# Patient Record
Sex: Female | Born: 1965 | Race: White | Hispanic: No | Smoking: Former smoker
Health system: Southern US, Community
[De-identification: ages and names within clinical notes are randomized; demographics above are authoritative.]

## PROBLEM LIST (undated history)

## (undated) DIAGNOSIS — E785 Hyperlipidemia, unspecified: Secondary | ICD-10-CM

## (undated) DIAGNOSIS — K219 Gastro-esophageal reflux disease without esophagitis: Secondary | ICD-10-CM

## (undated) DIAGNOSIS — I6529 Occlusion and stenosis of unspecified carotid artery: Secondary | ICD-10-CM

## (undated) DIAGNOSIS — I7300 Raynaud's syndrome without gangrene: Secondary | ICD-10-CM

## (undated) DIAGNOSIS — F32A Depression, unspecified: Secondary | ICD-10-CM

## (undated) DIAGNOSIS — I739 Peripheral vascular disease, unspecified: Secondary | ICD-10-CM

## (undated) DIAGNOSIS — F419 Anxiety disorder, unspecified: Secondary | ICD-10-CM

## (undated) DIAGNOSIS — M1990 Unspecified osteoarthritis, unspecified site: Secondary | ICD-10-CM

## (undated) DIAGNOSIS — I10 Essential (primary) hypertension: Secondary | ICD-10-CM

## (undated) HISTORY — DX: Raynaud's syndrome without gangrene: I73.00

## (undated) HISTORY — PX: CHOLECYSTECTOMY: SHX55

## (undated) HISTORY — PX: TONSILLECTOMY: SUR1361

## (undated) HISTORY — PX: SPINE SURGERY: SHX786

## (undated) HISTORY — PX: PTCA: SHX146

## (undated) HISTORY — PX: CERVICAL LAMINECTOMY: SHX94

---

## 2006-10-30 ENCOUNTER — Encounter: Payer: Self-pay | Admitting: Internal Medicine

## 2006-11-09 ENCOUNTER — Encounter: Payer: Self-pay | Admitting: Internal Medicine

## 2007-04-17 ENCOUNTER — Other Ambulatory Visit: Payer: Self-pay

## 2007-04-18 ENCOUNTER — Observation Stay: Payer: Self-pay | Admitting: Internal Medicine

## 2007-06-29 ENCOUNTER — Other Ambulatory Visit: Payer: Self-pay

## 2007-06-29 ENCOUNTER — Inpatient Hospital Stay: Payer: Self-pay | Admitting: Unknown Physician Specialty

## 2011-05-18 ENCOUNTER — Ambulatory Visit: Payer: Self-pay

## 2016-04-25 DIAGNOSIS — E782 Mixed hyperlipidemia: Secondary | ICD-10-CM | POA: Insufficient documentation

## 2016-04-25 DIAGNOSIS — I10 Essential (primary) hypertension: Secondary | ICD-10-CM | POA: Insufficient documentation

## 2016-08-21 ENCOUNTER — Encounter: Payer: Self-pay | Admitting: *Deleted

## 2016-08-22 ENCOUNTER — Ambulatory Visit
Admission: RE | Admit: 2016-08-22 | Discharge: 2016-08-22 | Disposition: A | Discharge status: Home / Self Care | Payer: BLUE CROSS/BLUE SHIELD | Source: Ambulatory Visit | Attending: Unknown Physician Specialty | Admitting: Unknown Physician Specialty

## 2016-08-22 ENCOUNTER — Ambulatory Visit: Payer: BLUE CROSS/BLUE SHIELD | Admitting: Anesthesiology

## 2016-08-22 ENCOUNTER — Encounter
Admission: RE | Disposition: A | Discharge status: Home / Self Care | Payer: Self-pay | Source: Ambulatory Visit | Attending: Unknown Physician Specialty

## 2016-08-22 ENCOUNTER — Encounter: Payer: Self-pay | Admitting: *Deleted

## 2016-08-22 DIAGNOSIS — Z87891 Personal history of nicotine dependence: Secondary | ICD-10-CM | POA: Insufficient documentation

## 2016-08-22 DIAGNOSIS — K648 Other hemorrhoids: Secondary | ICD-10-CM | POA: Insufficient documentation

## 2016-08-22 DIAGNOSIS — Z1211 Encounter for screening for malignant neoplasm of colon: Secondary | ICD-10-CM | POA: Diagnosis not present

## 2016-08-22 DIAGNOSIS — D122 Benign neoplasm of ascending colon: Secondary | ICD-10-CM | POA: Insufficient documentation

## 2016-08-22 DIAGNOSIS — Z79899 Other long term (current) drug therapy: Secondary | ICD-10-CM | POA: Diagnosis not present

## 2016-08-22 DIAGNOSIS — Z7982 Long term (current) use of aspirin: Secondary | ICD-10-CM | POA: Diagnosis not present

## 2016-08-22 DIAGNOSIS — D120 Benign neoplasm of cecum: Secondary | ICD-10-CM | POA: Insufficient documentation

## 2016-08-22 DIAGNOSIS — K6389 Other specified diseases of intestine: Secondary | ICD-10-CM | POA: Insufficient documentation

## 2016-08-22 DIAGNOSIS — Z6830 Body mass index (BMI) 30.0-30.9, adult: Secondary | ICD-10-CM | POA: Diagnosis not present

## 2016-08-22 DIAGNOSIS — E785 Hyperlipidemia, unspecified: Secondary | ICD-10-CM | POA: Insufficient documentation

## 2016-08-22 DIAGNOSIS — Z793 Long term (current) use of hormonal contraceptives: Secondary | ICD-10-CM | POA: Diagnosis not present

## 2016-08-22 HISTORY — DX: Essential (primary) hypertension: I10

## 2016-08-22 HISTORY — DX: Hyperlipidemia, unspecified: E78.5

## 2016-08-22 HISTORY — PX: COLONOSCOPY WITH PROPOFOL: SHX5780

## 2016-08-22 HISTORY — DX: Peripheral vascular disease, unspecified: I73.9

## 2016-08-22 SURGERY — COLONOSCOPY WITH PROPOFOL
Anesthesia: General

## 2016-08-22 MED ORDER — SODIUM CHLORIDE 0.9 % IV SOLN: INTRAVENOUS | Status: DC

## 2016-08-22 MED ORDER — LIDOCAINE HCL (PF) 2 % IJ SOLN
INTRAMUSCULAR | Status: DC | PRN
  Administered 2016-08-22: 50 mg

## 2016-08-22 MED ORDER — LABETALOL HCL 5 MG/ML IV SOLN
INTRAVENOUS | Status: DC | PRN
  Administered 2016-08-22: 5 mg via INTRAVENOUS

## 2016-08-22 MED ORDER — MIDAZOLAM HCL 5 MG/5ML IJ SOLN
INTRAMUSCULAR | Status: DC | PRN
  Administered 2016-08-22: 1 mg via INTRAVENOUS

## 2016-08-22 MED ORDER — PROPOFOL 10 MG/ML IV BOLUS
INTRAVENOUS | Status: DC | PRN
  Administered 2016-08-22: 30 mg via INTRAVENOUS
  Administered 2016-08-22: 20 mg via INTRAVENOUS

## 2016-08-22 MED ORDER — PROPOFOL 500 MG/50ML IV EMUL
INTRAVENOUS | Status: DC | PRN
  Administered 2016-08-22: 100 ug/kg/min via INTRAVENOUS

## 2016-08-22 MED ORDER — FENTANYL CITRATE (PF) 100 MCG/2ML IJ SOLN
INTRAMUSCULAR | Status: DC | PRN
  Administered 2016-08-22: 50 ug via INTRAVENOUS

## 2016-08-22 MED ORDER — SODIUM CHLORIDE 0.9 % IV SOLN
INTRAVENOUS | Status: DC
  Administered 2016-08-22: 1000 mL via INTRAVENOUS

## 2016-08-22 MED ORDER — LIDOCAINE HCL (PF) 1 % IJ SOLN
INTRAMUSCULAR | Status: AC
  Filled 2016-08-22: qty 2

## 2016-08-22 NOTE — Transfer of Care (Signed)
Immediate Anesthesia Transfer of Care Note  Patient: Pamela Madden  Procedure(s) Performed: Procedure(s): COLONOSCOPY WITH PROPOFOL (N/A)  Patient Location: PACU  Anesthesia Type:General  Level of Consciousness: sedated  Airway & Oxygen Therapy: Patient Spontanous Breathing and Patient connected to nasal cannula oxygen  Post-op Assessment: Report given to RN and Post -op Vital signs reviewed and stable  Post vital signs: Reviewed and stable  Last Vitals:  Vitals:   08/22/16 1312 08/22/16 1440  BP: (!) 149/83   Pulse: 77   Resp: 20   Temp: 36.3 C 36.4 C    Last Pain:  Vitals:   08/22/16 1440  TempSrc: Tympanic  PainSc: 0-No pain         Complications: No apparent anesthesia complications

## 2016-08-22 NOTE — H&P (Signed)
Primary Care Physician:  Rusty Aus, MD Primary Gastroenterologist:  Dr. Vira Agar  Pre-Procedure History & Physical: HPI:  Pamela Madden is a 50 y.o. female is here for an colonoscopy.   Past Medical History:  Diagnosis Date  . Hyperlipidemia   . Hypertension   . PVD (peripheral vascular disease) ([place])     Past Surgical History:  Procedure Laterality Date  . CERVICAL LAMINECTOMY    . CHOLECYSTECTOMY    . PTCA    . SPINE SURGERY    . TONSILLECTOMY      Prior to Admission medications   Medication Sig Start Date End Date Taking? Authorizing Provider  ALPRAZolam Duanne Moron) 0.5 MG tablet Take 0.5 mg by mouth at bedtime as needed for anxiety.   Yes Historical Provider, MD  Ascorbic Acid (VITAMIN C) 1000 MG tablet Take 1,000 mg by mouth daily.   Yes Historical Provider, MD  aspirin 325 MG tablet Take 325 mg by mouth daily.   Yes Historical Provider, MD  bisoprolol (ZEBETA) 10 MG tablet Take 10 mg by mouth daily.   Yes Historical Provider, MD  buPROPion (WELLBUTRIN XL) 150 MG 24 hr tablet Take 150 mg by mouth daily.   Yes Historical Provider, MD  Coenzyme Q10 (COQ10) 100 MG CAPS Take by mouth.   Yes Historical Provider, MD  etodolac (LODINE) 400 MG tablet Take 400 mg by mouth 2 (two) times daily.   Yes Historical Provider, MD  levonorgestrel-ethinyl estradiol (JOLESSA) 0.15-0.03 MG tablet Take 1 tablet by mouth daily.   Yes Historical Provider, MD  magnesium oxide (MAG-OX) 400 MG tablet Take 400 mg by mouth daily.   Yes Historical Provider, MD  Multiple Vitamin (MULTIVITAMIN) tablet Take 1 tablet by mouth daily.   Yes Historical Provider, MD  pravastatin (PRAVACHOL) 40 MG tablet Take 40 mg by mouth daily.   Yes Historical Provider, MD  Probiotic Product (ACIDOPHILUS PROBIOTIC BLEND PO) Take by mouth.   Yes Historical Provider, MD    Allergies as of 08/07/2016  . (Not on File)    History reviewed. No pertinent family history.  Social History   Social History  . Marital  status: Single    Spouse name: N/A  . Number of children: N/A  . Years of education: N/A   Occupational History  . Not on file.   Social History Main Topics  . Smoking status: Former Smoker    Types: Cigarettes    Quit date: 1998  . Smokeless tobacco: Never Used  . Alcohol use No  . Drug use: No  . Sexual activity: Not on file   Other Topics Concern  . Not on file   Social History Narrative  . No narrative on file    Review of Systems: See HPI, otherwise negative ROS  Physical Exam: BP (!) 149/83   Pulse 77   Temp 97.3 F (36.3 C) (Tympanic)   Resp 20   Ht 5\' 6"  (1.676 m)   Wt 86.2 kg (190 lb)   LMP  (LMP Unknown)   BMI 30.67 kg/m  General:   Alert,  pleasant and cooperative in NAD Head:  Normocephalic and atraumatic. Neck:  Supple; no masses or thyromegaly. Lungs:  Clear throughout to auscultation.    Heart:  Regular rate and rhythm. Abdomen:  Soft, nontender and nondistended. Normal bowel sounds, without guarding, and without rebound.   Neurologic:  Alert and  oriented x4;  grossly normal neurologically.  Impression/Plan: Pamela Madden is here for an colonoscopy to  be performed for screening for colon cancer with [facility].  Risks, benefits, limitations, and alternatives regarding  colonoscopy have been reviewed with the patient.  Questions have been answered.  All parties agreeable.   Gaylyn Cheers, MD  08/22/2016, 2:05 PM

## 2016-08-22 NOTE — Op Note (Signed)
[facility] Gastroenterology Patient Name: Pamela Madden Procedure Date: 08/22/2016 2:06 PM MRN: [id number] Account #: [id number] Date of Birth: 11/28/1965 Admit Type: Outpatient Age: 50 Room: [facility] ENDO ROOM 4 Gender: Female Note Status: Finalized Procedure:            Colonoscopy Indications:          Screening for colorectal malignant neoplasm Providers:            Manya Silvas, MD Referring MD:         Rusty Aus, MD (Referring MD) Medicines:            Propofol per Anesthesia Complications:        No immediate complications. Procedure:            Pre-Anesthesia Assessment:                       - After reviewing the risks and benefits, the patient                        was deemed in satisfactory condition to undergo the                        procedure.                       After obtaining informed consent, the colonoscope was                        passed under direct vision. Throughout the procedure,                        the patient's blood pressure, pulse, and oxygen                        saturations were monitored continuously. The                        Colonoscope was introduced through the anus and                        advanced to the the cecum, identified by appendiceal                        orifice and ileocecal valve. The colonoscopy was                        performed without difficulty. The patient tolerated the                        procedure well. The quality of the bowel preparation                        was good. Findings:      A diminutive polyp was found in the ascending colon. The polyp was       sessile. The polyp was removed with a jumbo cold forceps. Resection and       retrieval were complete.      A small polyp was found in the cecum. The polyp was sessile. The polyp       was removed with a hot snare. Resection and retrieval were complete.  A diminutive polyp was found in the cecum. The polyp was sessile.  The       polyp was removed with a jumbo cold forceps. Resection and retrieval       were complete.      A diffuse area of moderate melanosis was found in the sigmoid colon, in       the descending colon, in the transverse colon, in the ascending colon       and in the cecum.      Internal hemorrhoids were found during endoscopy. The hemorrhoids were       small and Grade I (internal hemorrhoids that do not prolapse).      The exam was otherwise without abnormality. Impression:           - One diminutive polyp in the ascending colon, removed                        with a jumbo cold forceps. Resected and retrieved.                       - One small polyp in the cecum, removed with a hot                        snare. Resected and retrieved.                       - One diminutive polyp in the cecum, removed with a                        jumbo cold forceps. Resected and retrieved.                       - Melanosis in the colon.                       - Internal hemorrhoids.                       - The examination was otherwise normal. Recommendation:       - Await pathology results. Manya Silvas, MD 08/22/2016 2:41:22 PM This report has been signed electronically. Number of Addenda: 0 Note Initiated On: 08/22/2016 2:06 PM Scope Withdrawal Time: 0 hours 17 minutes 25 seconds  Total Procedure Duration: 0 hours 24 minutes 35 seconds       [facility]

## 2016-08-22 NOTE — Anesthesia Preprocedure Evaluation (Signed)
Anesthesia Evaluation  Patient identified by MRN, date of birth, ID band Patient awake    Reviewed: Allergy & Precautions  Airway Mallampati: II       Dental  (+) Teeth Intact   Pulmonary neg pulmonary ROS, former smoker,     + decreased breath sounds      Cardiovascular Exercise Tolerance: Good hypertension, Pt. on home beta blockers + Peripheral Vascular Disease   Rhythm:Regular Rate:Normal     Neuro/Psych negative neurological ROS     GI/Hepatic negative GI ROS, Neg liver ROS,   Endo/Other  Morbid obesity  Renal/GU negative Renal ROS     Musculoskeletal   Abdominal   Peds  Hematology negative hematology ROS (+)   Anesthesia Other Findings   Reproductive/Obstetrics                             Anesthesia Physical Anesthesia Plan  ASA: II  Anesthesia Plan: General   Post-op Pain Management:    Induction:   Airway Management Planned: Natural Airway and Nasal Cannula  Additional Equipment:   Intra-op Plan:   Post-operative Plan:   Informed Consent: I have reviewed the patients History and Physical, chart, labs and discussed the procedure including the risks, benefits and alternatives for the proposed anesthesia with the patient or authorized representative who has indicated his/her understanding and acceptance.     Plan Discussed with: CRNA  Anesthesia Plan Comments:         Anesthesia Quick Evaluation

## 2016-08-23 ENCOUNTER — Encounter: Payer: Self-pay | Admitting: Unknown Physician Specialty

## 2016-08-24 LAB — SURGICAL PATHOLOGY

## 2016-08-27 NOTE — Anesthesia Postprocedure Evaluation (Signed)
Anesthesia Post Note  Patient: [name]  Procedure(s) Performed: Procedure(s) (LRB): COLONOSCOPY WITH PROPOFOL (N/A)  Patient location during evaluation: PACU Anesthesia Type: General Level of consciousness: awake Pain management: pain level controlled Vital Signs Assessment: post-procedure vital signs reviewed and stable Respiratory status: spontaneous breathing Cardiovascular status: stable Anesthetic complications: no    Last Vitals:  Vitals:   08/22/16 1500 08/22/16 1520  BP: (!) 168/80 (!) 152/79  Pulse: 74 78  Resp: 18 17  Temp:      Last Pain:  Vitals:   08/23/16 0808  TempSrc:   PainSc: 0-No pain                 VAN STAVEREN,Armonii Sieh

## 2016-10-29 DIAGNOSIS — I7300 Raynaud's syndrome without gangrene: Secondary | ICD-10-CM | POA: Insufficient documentation

## 2016-10-29 DIAGNOSIS — D369 Benign neoplasm, unspecified site: Secondary | ICD-10-CM | POA: Insufficient documentation

## 2016-12-05 ENCOUNTER — Other Ambulatory Visit: Payer: Self-pay | Admitting: Obstetrics and Gynecology

## 2016-12-05 DIAGNOSIS — N6489 Other specified disorders of breast: Secondary | ICD-10-CM

## 2016-12-12 ENCOUNTER — Inpatient Hospital Stay
Admission: RE | Admit: 2016-12-12 | Discharge: 2016-12-12 | Disposition: A | Discharge status: Home / Self Care | Payer: Self-pay | Source: Ambulatory Visit | Attending: *Deleted | Admitting: *Deleted

## 2016-12-12 ENCOUNTER — Other Ambulatory Visit: Payer: Self-pay | Admitting: *Deleted

## 2016-12-12 DIAGNOSIS — Z9289 Personal history of other medical treatment: Secondary | ICD-10-CM

## 2016-12-17 ENCOUNTER — Other Ambulatory Visit: Payer: Self-pay | Admitting: Obstetrics and Gynecology

## 2016-12-17 DIAGNOSIS — N6489 Other specified disorders of breast: Secondary | ICD-10-CM

## 2016-12-31 ENCOUNTER — Encounter: Payer: Self-pay | Admitting: *Deleted

## 2016-12-31 ENCOUNTER — Ambulatory Visit: Payer: BLUE CROSS/BLUE SHIELD | Attending: Oncology | Admitting: *Deleted

## 2016-12-31 VITALS — BP 132/85 | HR 83 | Temp 97.4°F | Wt 207.4 lb

## 2016-12-31 DIAGNOSIS — N6489 Other specified disorders of breast: Secondary | ICD-10-CM

## 2016-12-31 DIAGNOSIS — I739 Peripheral vascular disease, unspecified: Secondary | ICD-10-CM | POA: Insufficient documentation

## 2016-12-31 NOTE — Progress Notes (Signed)
Subjective:     Patient ID: MARTENA EMANUELE, female   DOB: 1965-10-18, 51 y.o.   MRN: [id number]  HPI   Review of Systems     Objective:   Physical Exam  Pulmonary/Chest: Right breast exhibits no inverted nipple, no mass, no nipple discharge, no skin change and no tenderness. Left breast exhibits no inverted nipple, no mass, no nipple discharge, no skin change and no tenderness. Breasts are symmetrical.         Assessment:     51 year old White female referred to [facility] by Dr. Leafy Ro for financial assistance for mammogram and follow-up of an abnormal pap smear.  Last mammogram on 11/19/16 was a birads 3.  Patient did not follow-up because her insurance did not pay for a diagnostic mammogram, and she could not afford it.  Clinical breast exam today unremarkable. Taught self breast awareness.  Last pap on 11/19/16 was HPV positive ASCUS.  She will also need referred to GYN for colposcopy and possible biopsy.  Since [facility] is not contracted with [facility] for gyn services, I will need to schedule her with [facility] or [facility].  She is agreeable to either one.  Patient has been screened for eligibility.  She does not have any insurance, Medicare or Medicaid.  She also meets financial eligibility.  Hand-out given on the Affordable Care Act.     Plan:     Bilateral diagnostic mammogram and ultrasound ordered for follow-up of asymmetry and annual screening.  Patient is scheduled for Jan 18, 2017 @ 3:20.  Will have Jeanella Anton schedule patient for her gyn consult.  Will follow-up per [facility] protocol.

## 2016-12-31 NOTE — Patient Instructions (Signed)
Gave patient hand-out, Women Staying Healthy, Active and Well from [facility], with education on breast health, pap smears, heart and colon health. 

## 2017-01-15 ENCOUNTER — Other Ambulatory Visit: Payer: Self-pay | Admitting: Obstetrics and Gynecology

## 2017-01-15 ENCOUNTER — Ambulatory Visit (INDEPENDENT_AMBULATORY_CARE_PROVIDER_SITE_OTHER): Payer: BLUE CROSS/BLUE SHIELD | Admitting: Obstetrics and Gynecology

## 2017-01-15 ENCOUNTER — Encounter: Payer: Self-pay | Admitting: Obstetrics and Gynecology

## 2017-01-15 VITALS — BP 112/73 | HR 73 | Ht 67.0 in | Wt 209.2 lb

## 2017-01-15 DIAGNOSIS — B977 Papillomavirus as the cause of diseases classified elsewhere: Secondary | ICD-10-CM

## 2017-01-15 DIAGNOSIS — N72 Inflammatory disease of cervix uteri: Secondary | ICD-10-CM

## 2017-01-15 DIAGNOSIS — R87612 Low grade squamous intraepithelial lesion on cytologic smear of cervix (LGSIL): Secondary | ICD-10-CM

## 2017-01-15 NOTE — Progress Notes (Signed)
Referring Provider:  [facility]  HPI:  Pamela Madden is a 51 y.o.  [phone]  who presents today for evaluation and management of abnormal cervical cytology.    Dysplasia History:  LGSIL with POS HPV She thinks this is her first abnormal Pap.  ROS:  Pertinent items are noted in HPI.   She believes that she is newly menopausal but is taking continuous progesterone so she is unsure of any menstrual periods.  OB History  Gravida Para Term Preterm AB Living  2 2 2     2   SAB TAB Ectopic Multiple Live Births          2    # Outcome Date GA Lbr Len/2nd Weight Sex Delivery Anes PTL Lv  2 Term 1989    M Vag-Spont  N LIV  1 Term 1984    M Vag-Spont  N LIV      Past Medical History:  Diagnosis Date  . Hyperlipidemia   . Hypertension   . PVD (peripheral vascular disease) ([place])   . Raynaud disease     Past Surgical History:  Procedure Laterality Date  . CERVICAL LAMINECTOMY    . CHOLECYSTECTOMY    . COLONOSCOPY WITH PROPOFOL N/A 08/22/2016   Procedure: COLONOSCOPY WITH PROPOFOL;  Surgeon: Manya Silvas, MD;  Location: [facility] ENDOSCOPY;  Service: Endoscopy;  Laterality: N/A;  . PTCA    . SPINE SURGERY    . TONSILLECTOMY      SOCIAL HISTORY: History  Alcohol Use No   History  Drug Use No     Family History  Problem Relation Age of Onset  . Diabetes Mother   . Diabetes Father   . Diabetes Sister   . Stroke Paternal Grandmother     ALLERGIES:  Ace inhibitors; Erythromycin; and Iodine  She has a current medication list which includes the following prescription(s): alprazolam, aspirin, bisoprolol, bupropion, magnesium oxide, meloxicam, multivitamin, norethindrone, pravastatin, probiotic product, and shingrix.  Physical Exam: -Vitals:  BP 112/73   Pulse 73   Ht 5\' 7"  (1.702 m)   Wt 209 lb 3 oz (94.9 kg)   BMI 32.76 kg/m  GEN: WD, WN, NAD.  A+ O x 3, good mood and affect. ABD:  NT, ND.  Soft, no masses.  No hernias noted.   Pelvic:   Vulva: Normal appearance.  No  lesions.  Vagina: No lesions or abnormalities noted.  Support: Normal pelvic support.  Urethra No masses tenderness or scarring.  Meatus Normal size without lesions or prolapse.  Cervix: See below.  Anus: Normal exam.  No lesions.  Perineum: Normal exam.  No lesions.        Bimanual   Uterus: Normal size.  Non-tender.  Mobile.  AV.  Adnexae: No masses.  Non-tender to palpation.  Cul-de-sac: Negative for abnormality.   PROCEDURE: 1.  Urine Pregnancy Test:  negative 2.  Colposcopy performed with 4% acetic acid after verbal consent obtained                           -Aceto-white Lesions Location(s): 4-5 o'clock.              -Biopsy performed at 5 & 8 o'clock               -ECC indicated and performed: No.     -Biopsy sites made hemostatic with pressure and Monsel's solution   -Satisfactory colposcopy: Yes.      -Evidence of  Invasive cervical CA :  NO  ASSESSMENT:  Pamela Madden is a 51 y.o. [id number] here for  1. Low grade squamous intraepithelial lesion on cytologic smear of cervix (LGSIL)   2. High risk human papilloma virus (HPV) infection of cervix     PLAN: 1.  I discussed the grading system of pap smears and HPV high risk viral types.  We will discuss management after colpo results return.  Meds ordered this encounter  Medications  . SHINGRIX injection    Refill:  0          F/U  Return in about 1 week (around 01/22/2017).  Jeannie Fend ,MD 01/15/2017,11:33 AM

## 2017-01-17 LAB — PATHOLOGY

## 2017-01-18 ENCOUNTER — Ambulatory Visit
Admission: RE | Admit: 2017-01-18 | Discharge: 2017-01-18 | Disposition: A | Discharge status: Home / Self Care | Payer: BLUE CROSS/BLUE SHIELD | Source: Ambulatory Visit | Attending: Oncology | Admitting: Oncology

## 2017-01-18 DIAGNOSIS — N6489 Other specified disorders of breast: Secondary | ICD-10-CM

## 2017-01-22 ENCOUNTER — Ambulatory Visit (INDEPENDENT_AMBULATORY_CARE_PROVIDER_SITE_OTHER): Payer: BLUE CROSS/BLUE SHIELD | Admitting: Obstetrics and Gynecology

## 2017-01-22 ENCOUNTER — Encounter: Payer: Self-pay | Admitting: Obstetrics and Gynecology

## 2017-01-22 VITALS — BP 118/77 | HR 116 | Ht 67.0 in | Wt 208.1 lb

## 2017-01-22 DIAGNOSIS — B977 Papillomavirus as the cause of diseases classified elsewhere: Secondary | ICD-10-CM | POA: Diagnosis not present

## 2017-01-22 DIAGNOSIS — N951 Menopausal and female climacteric states: Secondary | ICD-10-CM

## 2017-01-22 DIAGNOSIS — N72 Inflammatory disease of cervix uteri: Secondary | ICD-10-CM

## 2017-01-22 NOTE — Progress Notes (Signed)
HPI:      [name] is a 51 y.o. [phone] who LMP was No LMP recorded. Patient is not currently having periods (Reason: Oral contraceptives).  Subjective:   She presents today For follow-up of her colposcopy. She also complains of being tired, having hot flashes and night sweats, and being disappointed at not taking OCPs any longer (she is on progesterone only pills).  She is very afraid of the possibility of ovulation and possible pregnancy and is not sure if she is in menopause. She would like to take some type of estrogen if possible.    Hx: The following portions of the patient's history were reviewed and updated as appropriate:             She  has a past medical history of Hyperlipidemia; Hypertension; PVD (peripheral vascular disease) ([place]); and Raynaud disease. She  does not have any pertinent problems on file. She  has a past surgical history that includes Spine surgery; Cervical laminectomy; Cholecystectomy; Mitral valve replacement; Tonsillectomy; and Colonoscopy with propofol (N/A, 08/22/2016). Her family history includes Diabetes in her father, mother, and sister; Stroke in her paternal grandmother. She  reports that she quit smoking about 20 years ago. Her smoking use included Cigarettes. She has never used smokeless tobacco. She reports that she does not drink alcohol or use drugs. She is allergic to ace inhibitors; erythromycin; and iodine.       Review of Systems:  Review of Systems  Constitutional: Denied constitutional symptoms, night sweats, recent illness, fatigue, fever, insomnia and weight loss.  Eyes: Denied eye symptoms, eye pain, photophobia, vision change and visual disturbance.  Ears/Nose/Throat/Neck: Denied ear, nose, throat or neck symptoms, hearing loss, nasal discharge, sinus congestion and sore throat.  Cardiovascular: Denied cardiovascular symptoms, arrhythmia, chest pain/pressure, edema, exercise intolerance, orthopnea and palpitations.   Respiratory: Denied pulmonary symptoms, asthma, pleuritic pain, productive sputum, cough, dyspnea and wheezing.  Gastrointestinal: Denied, gastro-esophageal reflux, melena, nausea and vomiting.  Genitourinary: Denied genitourinary symptoms including symptomatic vaginal discharge, pelvic relaxation issues, and urinary complaints.  Musculoskeletal: Denied musculoskeletal symptoms, stiffness, swelling, muscle weakness and myalgia.  Dermatologic: Denied dermatology symptoms, rash and scar.  Neurologic: Denied neurology symptoms, dizziness, headache, neck pain and syncope.  Psychiatric: Denied psychiatric symptoms, anxiety and depression.  Endocrine: See HPI for additional information.   Meds:   Current Outpatient Prescriptions on File Prior to Visit  Medication Sig Dispense Refill  . ALPRAZolam (XANAX) 0.5 MG tablet Take 0.5 mg by mouth at bedtime as needed for anxiety.    Marland Kitchen aspirin 325 MG tablet Take 325 mg by mouth daily.    Marland Kitchen buPROPion (WELLBUTRIN XL) 150 MG 24 hr tablet Take 150 mg by mouth daily.    . magnesium oxide (MAG-OX) 400 MG tablet Take 400 mg by mouth daily.    . meloxicam (MOBIC) 15 MG tablet Take by mouth.    . Multiple Vitamin (MULTIVITAMIN) tablet Take 1 tablet by mouth daily.    . norethindrone (MICRONOR,CAMILA,ERRIN) 0.35 MG tablet Take by mouth.    . pravastatin (PRAVACHOL) 40 MG tablet Take 40 mg by mouth daily.    . Probiotic Product (ACIDOPHILUS PROBIOTIC BLEND PO) Take by mouth.     No current facility-administered medications on file prior to visit.     Objective:     Vitals:   01/22/17 1446  BP: 118/77  Pulse: (!) 116              Colposcopy results reviewed in detail  with the patient. Koilocytic change and HPV effect discussed at great length.  Assessment:    [id number] Patient Active Problem List   Diagnosis Date Noted  . Peripheral vascular disease ([place]) 12/31/2016  . Raynaud's disease without gangrene 10/29/2016  . Tubular adenoma 10/29/2016  .  Benign essential hypertension 04/25/2016  . Hyperlipidemia, mixed 04/25/2016     1. High risk human papilloma virus (HPV) infection of cervix   2. Symptomatic menopausal or female climacteric states     Minimal changes at colposcopy.  Patient very symptomatic with menopausal type symptoms now no longer taking estrogen. Patient very interested in HRT for birth control which ever is appropriate.    Plan:            1.  We have discussed her abnormal Pap smear and colposcopy in detail. I recommended a follow-up Pap in 6 months.  2.  [facility]SH today - will consider HRT or OCPs  Orders Placed This Encounter  Procedures  . Follicle stimulating hormone         F/U  Return in about 6 months (around 07/25/2017) for We will contact her with any abnormal test results.  Finis Bud, M.D. 01/22/2017 3:33 PM

## 2017-01-23 LAB — FOLLICLE STIMULATING HORMONE: FSH: 68.6 m[IU]/mL

## 2017-01-24 ENCOUNTER — Telehealth: Payer: Self-pay

## 2017-01-24 NOTE — Telephone Encounter (Signed)
-----   Message from Harlin Heys, MD sent at 01/23/2017  5:42 PM EDT ----- Based on these results, please have her schedule a visit with me.

## 2017-01-24 NOTE — Telephone Encounter (Signed)
Mychart message sent to pt to schedule appt. For lab review.

## 2017-01-25 ENCOUNTER — Encounter: Payer: Self-pay | Admitting: Obstetrics and Gynecology

## 2017-01-25 ENCOUNTER — Ambulatory Visit (INDEPENDENT_AMBULATORY_CARE_PROVIDER_SITE_OTHER): Payer: BLUE CROSS/BLUE SHIELD | Admitting: Obstetrics and Gynecology

## 2017-01-25 DIAGNOSIS — N951 Menopausal and female climacteric states: Secondary | ICD-10-CM | POA: Diagnosis not present

## 2017-01-25 DIAGNOSIS — Z79890 Hormone replacement therapy (postmenopausal): Secondary | ICD-10-CM | POA: Diagnosis not present

## 2017-01-25 MED ORDER — ESTRADIOL-NORETHINDRONE ACET 1-0.5 MG PO TABS: 1.0000 | ORAL_TABLET | Freq: Every day | ORAL | 0 refills | Status: DC

## 2017-01-25 NOTE — Progress Notes (Signed)
HPI:      [name] is a 51 y.o. [id number] who is not having menstrual periods.  Subjective:   She presents today To further discuss her symptoms of menopause which include mood swings, hot flashes, difficulty sleeping, and generalized depressive symptoms. She is also on bupropion and would like to discontinue this medicine as it does not seem to be working for her. She is taking daily progesterone.    Hx: The following portions of the patient's history were reviewed and updated as appropriate:             She  has a past medical history of Hyperlipidemia; Hypertension; PVD (peripheral vascular disease) ([place]); and Raynaud disease. She  does not have any pertinent problems on file. She  has a past surgical history that includes Spine surgery; Cervical laminectomy; Cholecystectomy; Mitral valve replacement; Tonsillectomy; and Colonoscopy with propofol (N/A, 08/22/2016). Her family history includes Diabetes in her father, mother, and sister; Stroke in her paternal grandmother. She  reports that she quit smoking about 20 years ago. Her smoking use included Cigarettes. She has never used smokeless tobacco. She reports that she does not drink alcohol or use drugs. She is allergic to ace inhibitors; erythromycin; and iodine.       Review of Systems:  Review of Systems  Constitutional: Denied constitutional symptoms, night sweats, recent illness, fatigue, fever, insomnia and weight loss.  Eyes: Denied eye symptoms, eye pain, photophobia, vision change and visual disturbance.  Ears/Nose/Throat/Neck: Denied ear, nose, throat or neck symptoms, hearing loss, nasal discharge, sinus congestion and sore throat.  Cardiovascular: Denied cardiovascular symptoms, arrhythmia, chest pain/pressure, edema, exercise intolerance, orthopnea and palpitations.  Respiratory: Denied pulmonary symptoms, asthma, pleuritic pain, productive sputum, cough, dyspnea and wheezing.  Gastrointestinal: Denied,  gastro-esophageal reflux, melena, nausea and vomiting.  Genitourinary: Denied genitourinary symptoms including symptomatic vaginal discharge, pelvic relaxation issues, and urinary complaints.  Musculoskeletal: Denied musculoskeletal symptoms, stiffness, swelling, muscle weakness and myalgia.  Dermatologic: Denied dermatology symptoms, rash and scar.  Neurologic: Denied neurology symptoms, dizziness, headache, neck pain and syncope.  Psychiatric: Denied psychiatric symptoms, anxiety and depression.  Endocrine: See HPI for additional information.   Meds:   Current Outpatient Prescriptions on File Prior to Visit  Medication Sig Dispense Refill  . ALPRAZolam (XANAX) 0.5 MG tablet Take 0.5 mg by mouth at bedtime as needed for anxiety.    Marland Kitchen aspirin 325 MG tablet Take 325 mg by mouth daily.    Marland Kitchen buPROPion (WELLBUTRIN XL) 150 MG 24 hr tablet Take 150 mg by mouth daily.    Marland Kitchen losartan (COZAAR) 25 MG tablet Take by mouth.    . magnesium oxide (MAG-OX) 400 MG tablet Take 400 mg by mouth daily.    . meloxicam (MOBIC) 15 MG tablet Take by mouth.    . Multiple Vitamin (MULTIVITAMIN) tablet Take 1 tablet by mouth daily.    . norethindrone (MICRONOR,CAMILA,ERRIN) 0.35 MG tablet Take by mouth.    . pravastatin (PRAVACHOL) 40 MG tablet Take 40 mg by mouth daily.    . Probiotic Product (ACIDOPHILUS PROBIOTIC BLEND PO) Take by mouth.    . Zoster Vac Recomb Adjuvanted [facility]) injection Inject 0.5 mLs into the muscle once.     No current facility-administered medications on file prior to visit.     Objective:     There were no vitals filed for this visit.              Assessment:    [id number] Patient Active Problem  List   Diagnosis Date Noted  . Peripheral vascular disease ([place]) 12/31/2016  . Raynaud's disease without gangrene 10/29/2016  . Tubular adenoma 10/29/2016  . Benign essential hypertension 04/25/2016  . Hyperlipidemia, mixed 04/25/2016     1. Postmenopausal hormone therapy   2.  Symptomatic menopausal or female climacteric states     [place] consistent with menopause.   Plan:            1.  HRT I have discussed HRT with the patient in detail.  The risk/benefits of it were reviewed.  She understands that during menopause Estrogen decreases dramatically and that this results in an increased risk of cardiovascular disease as well as osteoporosis.  We have also discussed the fact that hot flashes often result from a decrease in Estrogen, and that by replacing Estrogen, they can often be alleviated.  We have discussed skin, vaginal and urinary tract changes that may also take place from this drop in Estrogen.  Emotional changes have also been linked to Estrogen and we have briefly discussed this.  The benefits of HRT including decrease in hot flashes, vaginal dryness, and osteoporosis were discussed.  The emotional benefit and a possible change in her cardiovascular risk profile was also reviewed.  The risks associated with Hormone Replacement Therapy were also reviewed.  The use of unopposed Estrogen and its relationship to endometrial cancer was discussed.  The addition of Progesterone and its beneficial effect on endometrial cancer was also noted.  The fact that there has been no consistent definitive studies showing an increase in breast cancer in women who use HRT was discussed with the patient.  The possible side effects including breast tenderness, fluid retention, mood changes and vaginal bleeding were discussed.  The patient was informed that this is an elective medication and that she may choose not to take Hormone Replacement Therapy.  Literature on HRT was given, and I believe that after answering all of the patient's questions, she has an adequate and informed understanding of HRT.  Special emphasis on the WHI study, as well as several studies since that pertaining to the risks and benefits of estrogen replacement therapy were compared.  The possible limitations of these  studies were discussed including the age stratification of the WHI study.  The possible role of Progesterone in these studies was discussed in detail.  I believe that the patient has an informed knowledge of the risks and benefits of HRT.  I have specifically discussed WHI findings and current updates.  Different type of hormone formulation and methods of taking hormone replacement therapy discussed.   We have specifically discussed her peripheral vascular disease and the possible estrogen and progesterone effects.     2.  After taking HRT for 3 weeks patient will gradually wean off of bupropion as directed.    Meds ordered this encounter  Medications  . estradiol-norethindrone (ACTIVELLA) 1-0.5 MG tablet    Sig: Take 1 tablet by mouth daily.    Dispense:  90 tablet    Refill:  0        F/U  Return in about 3 months (around 04/27/2017).  Finis Bud, M.D. 01/25/2017 11:22 AM

## 2017-02-07 ENCOUNTER — Encounter: Payer: Self-pay | Admitting: *Deleted

## 2017-02-07 NOTE — Progress Notes (Signed)
Letter mailed from the [facility] to inform patient of her normal mammogram results.  Patient is to follow-up with annual screening in one year.  Patient will need next pap in 6 months with Dr. Amalia Hailey at [facility].  [facility]IS to [place].

## 2017-03-06 ENCOUNTER — Other Ambulatory Visit: Payer: Self-pay | Admitting: Obstetrics and Gynecology

## 2017-03-06 DIAGNOSIS — N951 Menopausal and female climacteric states: Secondary | ICD-10-CM

## 2017-03-06 DIAGNOSIS — Z79890 Hormone replacement therapy (postmenopausal): Secondary | ICD-10-CM

## 2017-03-28 ENCOUNTER — Encounter: Payer: Self-pay | Admitting: Obstetrics and Gynecology

## 2017-04-08 ENCOUNTER — Telehealth: Payer: Self-pay | Admitting: Obstetrics and Gynecology

## 2017-04-08 ENCOUNTER — Other Ambulatory Visit: Payer: Self-pay

## 2017-04-08 DIAGNOSIS — N951 Menopausal and female climacteric states: Secondary | ICD-10-CM

## 2017-04-08 DIAGNOSIS — Z79890 Hormone replacement therapy (postmenopausal): Secondary | ICD-10-CM

## 2017-04-08 NOTE — Telephone Encounter (Signed)
Patient needs refill on her [facility] pills sent to [facility] she only has 14 days left

## 2017-04-09 ENCOUNTER — Other Ambulatory Visit: Payer: Self-pay

## 2017-04-09 DIAGNOSIS — Z79890 Hormone replacement therapy (postmenopausal): Secondary | ICD-10-CM

## 2017-04-09 DIAGNOSIS — N951 Menopausal and female climacteric states: Secondary | ICD-10-CM

## 2017-04-09 MED ORDER — ESTRADIOL-NORETHINDRONE ACET 1-0.5 MG PO TABS: 1.0000 | ORAL_TABLET | Freq: Every day | ORAL | 0 refills | Status: DC

## 2017-04-09 NOTE — Telephone Encounter (Signed)
mychart message sent

## 2017-04-30 ENCOUNTER — Encounter: Payer: PRIVATE HEALTH INSURANCE | Admitting: Obstetrics and Gynecology

## 2017-05-01 ENCOUNTER — Encounter: Payer: PRIVATE HEALTH INSURANCE | Admitting: Obstetrics and Gynecology

## 2017-05-01 ENCOUNTER — Encounter: Payer: Self-pay | Admitting: Obstetrics and Gynecology

## 2017-05-01 ENCOUNTER — Ambulatory Visit (INDEPENDENT_AMBULATORY_CARE_PROVIDER_SITE_OTHER): Payer: BLUE CROSS/BLUE SHIELD | Admitting: Obstetrics and Gynecology

## 2017-05-01 VITALS — BP 137/77 | HR 77 | Ht 67.0 in | Wt 210.1 lb

## 2017-05-01 DIAGNOSIS — Z79890 Hormone replacement therapy (postmenopausal): Secondary | ICD-10-CM | POA: Diagnosis not present

## 2017-05-01 DIAGNOSIS — N951 Menopausal and female climacteric states: Secondary | ICD-10-CM

## 2017-05-01 MED ORDER — ESTRADIOL-NORETHINDRONE ACET 1-0.5 MG PO TABS: 1.0000 | ORAL_TABLET | Freq: Every day | ORAL | 8 refills | Status: DC

## 2017-05-01 NOTE — Progress Notes (Signed)
HPI:      [name] is a 51 y.o. [phone] who LMP was No LMP recorded. Patient is not currently having periods (Reason: Oral contraceptives).  Subjective:   She presents today For follow-up of HRT. She reports no bleeding. She says that her hot flashes and night sweats have resolved, she is sleeping better, her mood issues have resolved. She is disappointed in the fact that she is tired at the end of the day and this has not seemed to affect her energy level. She has discussed this with her family physician but there has been no resolution. He ordered lab work including thyroid testing which she reports is all normal.    Hx: The following portions of the patient's history were reviewed and updated as appropriate:             She  has a past medical history of Hyperlipidemia; Hypertension; PVD (peripheral vascular disease) ([place]); and Raynaud disease. She  does not have any pertinent problems on file. She  has a past surgical history that includes Spine surgery; Cervical laminectomy; Cholecystectomy; Mitral valve replacement; Tonsillectomy; and Colonoscopy with propofol (N/A, 08/22/2016). Her family history includes Diabetes in her father, mother, and sister; Stroke in her paternal grandmother. She  reports that she quit smoking about 20 years ago. Her smoking use included Cigarettes. She has never used smokeless tobacco. She reports that she does not drink alcohol or use drugs. She is allergic to ace inhibitors; erythromycin; and iodine.       Review of Systems:  Review of Systems  Constitutional: Denied constitutional symptoms, night sweats, recent illness, fatigue, fever, insomnia and weight loss.  Eyes: Denied eye symptoms, eye pain, photophobia, vision change and visual disturbance.  Ears/Nose/Throat/Neck: Denied ear, nose, throat or neck symptoms, hearing loss, nasal discharge, sinus congestion and sore throat.  Cardiovascular: Denied cardiovascular symptoms, arrhythmia, chest  pain/pressure, edema, exercise intolerance, orthopnea and palpitations.  Respiratory: Denied pulmonary symptoms, asthma, pleuritic pain, productive sputum, cough, dyspnea and wheezing.  Gastrointestinal: Denied, gastro-esophageal reflux, melena, nausea and vomiting.  Genitourinary: Denied genitourinary symptoms including symptomatic vaginal discharge, pelvic relaxation issues, and urinary complaints.  Musculoskeletal: Denied musculoskeletal symptoms, stiffness, swelling, muscle weakness and myalgia.  Dermatologic: Denied dermatology symptoms, rash and scar.  Neurologic: Denied neurology symptoms, dizziness, headache, neck pain and syncope.  Psychiatric: Denied psychiatric symptoms, anxiety and depression.  Endocrine: Denied endocrine symptoms including hot flashes and night sweats.   Meds:   Current Outpatient Prescriptions on File Prior to Visit  Medication Sig Dispense Refill  . ALPRAZolam (XANAX) 0.5 MG tablet Take 0.25 mg by mouth at bedtime as needed for anxiety.     Marland Kitchen aspirin 325 MG tablet Take 325 mg by mouth daily.    Marland Kitchen buPROPion (WELLBUTRIN XL) 150 MG 24 hr tablet Take 150 mg by mouth daily.    . magnesium oxide (MAG-OX) 400 MG tablet Take 400 mg by mouth 2 (two) times daily.     . meloxicam (MOBIC) 15 MG tablet Take 7.5 mg by mouth.     . Multiple Vitamin (MULTIVITAMIN) tablet Take 1 tablet by mouth daily.    . norethindrone (MICRONOR,CAMILA,ERRIN) 0.35 MG tablet Take by mouth.    . pravastatin (PRAVACHOL) 40 MG tablet Take 20 mg by mouth daily.     . Probiotic Product (ACIDOPHILUS PROBIOTIC BLEND PO) Take by mouth.    . Zoster Vac Recomb Adjuvanted [facility]) injection Inject 0.5 mLs into the muscle once.    Marland Kitchen losartan (COZAAR)  25 MG tablet Take by mouth.     No current facility-administered medications on file prior to visit.     Objective:     Vitals:   05/01/17 1054  BP: 137/77  Pulse: 77                Assessment:    G2P2002 Patient Active Problem List    Diagnosis Date Noted  . Peripheral vascular disease ([place]) 12/31/2016  . Raynaud's disease without gangrene 10/29/2016  . Tubular adenoma 10/29/2016  . Benign essential hypertension 04/25/2016  . Hyperlipidemia, mixed 04/25/2016     1. Postmenopausal hormone therapy   2. Symptomatic menopausal or female climacteric states     HRT doing well many signs and symptoms of menopause resolved.   Plan:            1.  Continue HRT Orders No orders of the defined types were placed in this encounter.    Meds ordered this encounter  Medications  . predniSONE (DELTASONE) 10 MG tablet    Sig: Take 10 mg by mouth daily with breakfast.  . estradiol-norethindrone (ACTIVELLA) 1-0.5 MG tablet    Sig: Take 1 tablet by mouth daily.    Dispense:  30 tablet    Refill:  8        F/U  Return for As scheduled. I spent 16 minutes with this patient of which greater than 50% was spent discussing HRT, signs and symptoms, fatigue, continued expectations.  Finis Bud, M.D. 05/01/2017 11:38 AM

## 2017-07-25 ENCOUNTER — Encounter: Payer: Self-pay | Admitting: Obstetrics and Gynecology

## 2017-07-25 ENCOUNTER — Ambulatory Visit (INDEPENDENT_AMBULATORY_CARE_PROVIDER_SITE_OTHER): Payer: BLUE CROSS/BLUE SHIELD | Admitting: Obstetrics and Gynecology

## 2017-07-25 VITALS — BP 144/82 | HR 87 | Ht 66.5 in | Wt 209.6 lb

## 2017-07-25 DIAGNOSIS — B977 Papillomavirus as the cause of diseases classified elsewhere: Secondary | ICD-10-CM | POA: Diagnosis not present

## 2017-07-25 DIAGNOSIS — N72 Inflammatory disease of cervix uteri: Secondary | ICD-10-CM

## 2017-07-25 DIAGNOSIS — R87612 Low grade squamous intraepithelial lesion on cytologic smear of cervix (LGSIL): Secondary | ICD-10-CM | POA: Diagnosis not present

## 2017-07-25 NOTE — Progress Notes (Signed)
HPI:      [name] is a 51 y.o. [phone] who LMP was No LMP recorded. Patient is not currently having periods (Reason: Oral contraceptives).  Subjective:   She presents today for her follow-up Pap smear.  She had a previous Pap LGSIL with positive HPV.  Her colposcopy showed only koilocytic change.  She presents today for a six-month follow-up Pap. She has also been taking Activella for HRT and is very happy on this this has helped many of her menopausal symptoms.    Hx: The following portions of the patient's history were reviewed and updated as appropriate:             She  has a past medical history of Hyperlipidemia, Hypertension, PVD (peripheral vascular disease) ([place]), and Raynaud disease. She does not have any pertinent problems on file. She  has a past surgical history that includes Spine surgery; Cervical laminectomy; Cholecystectomy; Mitral valve replacement; Tonsillectomy; and Colonoscopy with propofol (N/A, 08/22/2016). Her family history includes Diabetes in her father, mother, and sister; Stroke in her paternal grandmother. She  reports that she quit smoking about 20 years ago. Her smoking use included cigarettes. she has never used smokeless tobacco. She reports that she does not drink alcohol or use drugs. She is allergic to ace inhibitors; erythromycin; and iodine.       Review of Systems:  Review of Systems  Constitutional: Denied constitutional symptoms, night sweats, recent illness, fatigue, fever, insomnia and weight loss.  Eyes: Denied eye symptoms, eye pain, photophobia, vision change and visual disturbance.  Ears/Nose/Throat/Neck: Denied ear, nose, throat or neck symptoms, hearing loss, nasal discharge, sinus congestion and sore throat.  Cardiovascular: Denied cardiovascular symptoms, arrhythmia, chest pain/pressure, edema, exercise intolerance, orthopnea and palpitations.  Respiratory: Denied pulmonary symptoms, asthma, pleuritic pain, productive sputum,  cough, dyspnea and wheezing.  Gastrointestinal: Denied, gastro-esophageal reflux, melena, nausea and vomiting.  Genitourinary: Denied genitourinary symptoms including symptomatic vaginal discharge, pelvic relaxation issues, and urinary complaints.  Musculoskeletal: Denied musculoskeletal symptoms, stiffness, swelling, muscle weakness and myalgia.  Dermatologic: Denied dermatology symptoms, rash and scar.  Neurologic: Denied neurology symptoms, dizziness, headache, neck pain and syncope.  Psychiatric: Denied psychiatric symptoms, anxiety and depression.  Endocrine: Denied endocrine symptoms including hot flashes and night sweats.   Meds:   Current Outpatient Medications on File Prior to Visit  Medication Sig Dispense Refill  . ALPRAZolam (XANAX) 0.5 MG tablet Take 0.25 mg by mouth at bedtime as needed for anxiety.     Marland Kitchen aspirin 325 MG tablet Take 325 mg by mouth daily.    Marland Kitchen buPROPion (WELLBUTRIN XL) 150 MG 24 hr tablet Take 150 mg by mouth daily.    Marland Kitchen LOPREEZA 1-0.5 MG tablet   7  . magnesium oxide (MAG-OX) 400 MG tablet Take 400 mg by mouth 2 (two) times daily.     . meloxicam (MOBIC) 15 MG tablet Take 7.5 mg by mouth.     . Multiple Vitamin (MULTIVITAMIN) tablet Take 1 tablet by mouth daily.    . pravastatin (PRAVACHOL) 40 MG tablet Take 20 mg by mouth daily.     Marland Kitchen estradiol-norethindrone (ACTIVELLA) 1-0.5 MG tablet Take 1 tablet by mouth daily. 30 tablet 8   No current facility-administered medications on file prior to visit.     Objective:     Vitals:   07/25/17 1528  BP: (!) 144/82  Pulse: 87              Physical examination   Pelvic:  Vulva: Normal appearance.  No lesions.  Vagina: No lesions or abnormalities noted.  Support: Normal pelvic support.  Urethra No masses tenderness or scarring.  Meatus Normal size without lesions or prolapse.  Cervix: Normal appearance.  No lesions.  Anus: Normal exam.  No lesions.  Perineum: Normal exam.  No lesions.        Bimanual    Uterus: Normal size.  Non-tender.  Mobile.  AV.  Adnexae: No masses.  Non-tender to palpation.  Cul-de-sac: Negative for abnormality.     Assessment:    [id number] Patient Active Problem List   Diagnosis Date Noted  . Peripheral vascular disease ([place]) 12/31/2016  . Raynaud's disease without gangrene 10/29/2016  . Tubular adenoma 10/29/2016  . Benign essential hypertension 04/25/2016  . Hyperlipidemia, mixed 04/25/2016     1. Low grade squamous intraepithelial lesion on cytologic smear of cervix (LGSIL)   2. High risk human papilloma virus (HPV) infection of cervix        Plan:            1.  Pap-based management on findings.   2.  Recommend annual exam and Pap in 6 months.    3.  Continue HRT. Orders No orders of the defined types were placed in this encounter.   No orders of the defined types were placed in this encounter.     F/U  Return in about 6 months (around 01/22/2018) for Annual Physical.  Finis Bud, M.D. 07/25/2017 4:14 PM

## 2017-07-30 LAB — PAP IG AND HPV HIGH-RISK
HPV, high-risk: NEGATIVE
PAP SMEAR COMMENT: 0

## 2017-10-08 ENCOUNTER — Encounter: Payer: Self-pay | Admitting: Obstetrics and Gynecology

## 2017-10-09 ENCOUNTER — Telehealth: Payer: Self-pay

## 2017-10-09 NOTE — Telephone Encounter (Signed)
mychart message sent

## 2017-10-30 ENCOUNTER — Encounter: Payer: Self-pay | Admitting: Obstetrics and Gynecology

## 2017-10-30 ENCOUNTER — Telehealth: Payer: Self-pay | Admitting: *Deleted

## 2017-10-30 NOTE — Telephone Encounter (Signed)
Patient states that she is currently taking a hormone pill that is too much with her insurance. The patient is stating that she needs a RX for a hormone pill that is compatible or similar to that medication. Patient pharmacy is [facility] . If you have any questions. Please call pt # [phone] Or at her job [phone] Thank you

## 2017-10-30 NOTE — Telephone Encounter (Signed)
DJE out of office Will send to mad.

## 2017-10-31 NOTE — Telephone Encounter (Signed)
lmtrc

## 2017-10-31 NOTE — Telephone Encounter (Signed)
Pt aware. Given all meds. She will check with her insurance and call back.

## 2017-11-18 ENCOUNTER — Encounter: Payer: Self-pay | Admitting: Obstetrics and Gynecology

## 2017-12-19 ENCOUNTER — Ambulatory Visit: Payer: BLUE CROSS/BLUE SHIELD | Admitting: Obstetrics and Gynecology

## 2017-12-19 ENCOUNTER — Encounter: Payer: Self-pay | Admitting: Obstetrics and Gynecology

## 2017-12-19 VITALS — BP 131/90 | HR 86 | Ht 66.5 in | Wt 209.1 lb

## 2017-12-19 DIAGNOSIS — E669 Obesity, unspecified: Secondary | ICD-10-CM

## 2017-12-19 NOTE — Progress Notes (Signed)
Subjective:  Pamela Madden is a 52 y.o. G2P2002 at Unknown being seen today for weight loss management- initial visit.  Patient reports General ROS: negative and reports previous weight loss attempts:   Pertinent medical history includes: htn.    The patient has a surgical history of: TONSILECTOMY, cholecystectomy and leg surgery.    Past evaluation has included: metabolic profile, hemoglobin A1c, Past treatment has included: small frequent feedings, nutritional supplement,  vitamin B-12 injections, appetite stimulant, exercise management and discontinuation of medication.  The following portions of the patient's history were reviewed and updated as appropriate: allergies, current medications, past family history, past medical history, past social history, past surgical history and problem list.   Objective:   Vitals:   12/19/17 1014  BP: 131/90  Pulse: 86  Weight: 209 lb 1.6 oz (94.8 kg)  Height: 5' 6.5" (1.689 m)    General:  Alert, oriented and cooperative. Patient is in no acute distress.  :   :   :   :   :   :   PE: Well groomed female in no current distress,   Mental Status: Normal mood and affect. Normal behavior. Normal judgment and thought content.   Current BMI: Body mass index is 33.24 kg/m.   Assessment and Plan:  Obesity  There are no diagnoses linked to this encounter.  Plan: low carb, High protein diet RX for adipex 37.5 mg daily and B12 106mcg.ml monthly, to start now with first injection given at today's visit. Reviewed side-effects common to both medications and expected outcomes. Increase daily water intake to at least 8 bottle a day, every day.  Goal is to reduse weight by 10% by end of three months, and will re-evaluate then.  RTC in 4 weeks for Nurse visit to check weight & BP, and get next B12 injections.    Please refer to After Visit Summary for other counseling recommendations.    Rosebud, Savahanna Almendariz N, CNM   Presley Gora [place],  CNM      Consider the Low Glycemic Index Diet and 6 smaller meals daily .  This boosts your metabolism and regulates your sugars:   Use the protein bar by [employer] because they have lots of fiber in them  Find the low carb flatbreads, tortillas and pita breads for sandwiches:  Joseph's makes a pita bread and a flat bread , available at [facility] and [employer]; [place] makes a low carb flatbread available at [employer] and HT that is 9 net carbs and 100 cal [place] makes a low carb whole wheat tortilla available at [employer] most grocery stores with 6 net carbs and 210 cal  [place] yogurt can still have a lot of carbs .  Dannon Light N fit has 80 cal and 8 carbs

## 2017-12-19 NOTE — Patient Instructions (Signed)

## 2017-12-26 ENCOUNTER — Encounter: Payer: BLUE CROSS/BLUE SHIELD | Admitting: Obstetrics and Gynecology

## 2018-01-17 ENCOUNTER — Ambulatory Visit (INDEPENDENT_AMBULATORY_CARE_PROVIDER_SITE_OTHER): Payer: BLUE CROSS/BLUE SHIELD | Admitting: Obstetrics and Gynecology

## 2018-01-17 ENCOUNTER — Encounter: Payer: Self-pay | Admitting: Obstetrics and Gynecology

## 2018-01-17 VITALS — BP 140/95 | HR 98 | Wt 209.0 lb

## 2018-01-17 DIAGNOSIS — E669 Obesity, unspecified: Secondary | ICD-10-CM

## 2018-01-17 MED ORDER — CYANOCOBALAMIN 1000 MCG/ML IJ SOLN
1000.0000 ug | Freq: Once | INTRAMUSCULAR | Status: AC
  Administered 2018-01-17: 1000 ug via INTRAMUSCULAR

## 2018-01-17 NOTE — Progress Notes (Signed)
Pt is here for wt, bp check, b-12 inj She is doing well, is having a lot of problems with constipation, advised pt to start on a stool softner    01/17/18 wt- 209lb 12/19/17 wt- 209lb

## 2018-01-21 ENCOUNTER — Other Ambulatory Visit: Payer: Self-pay | Admitting: Obstetrics and Gynecology

## 2018-01-21 DIAGNOSIS — Z79890 Hormone replacement therapy (postmenopausal): Secondary | ICD-10-CM

## 2018-01-21 DIAGNOSIS — N951 Menopausal and female climacteric states: Secondary | ICD-10-CM

## 2018-01-23 ENCOUNTER — Ambulatory Visit (INDEPENDENT_AMBULATORY_CARE_PROVIDER_SITE_OTHER): Payer: BLUE CROSS/BLUE SHIELD | Admitting: Obstetrics and Gynecology

## 2018-01-23 ENCOUNTER — Encounter: Payer: Self-pay | Admitting: Obstetrics and Gynecology

## 2018-01-23 VITALS — BP 118/71 | HR 77 | Ht 67.0 in | Wt 209.2 lb

## 2018-01-23 DIAGNOSIS — N951 Menopausal and female climacteric states: Secondary | ICD-10-CM | POA: Diagnosis not present

## 2018-01-23 DIAGNOSIS — Z01419 Encounter for gynecological examination (general) (routine) without abnormal findings: Secondary | ICD-10-CM | POA: Diagnosis not present

## 2018-01-23 MED ORDER — ESTRADIOL 1 MG PO TABS: 1.5000 mg | ORAL_TABLET | Freq: Every day | ORAL | 3 refills | Status: DC

## 2018-01-23 MED ORDER — MEDROXYPROGESTERONE ACETATE 2.5 MG PO TABS: 2.5000 mg | ORAL_TABLET | Freq: Every day | ORAL | 3 refills | Status: DC

## 2018-01-23 NOTE — Progress Notes (Signed)
Pt denies any itching, burning or discharge. Pt stated that she is really constipated; no hemorrhoids. Pt stated that she has tired a lot of OTC medication and none seems to help.

## 2018-01-23 NOTE — Progress Notes (Signed)
HPI:      [name] is a 52 y.o. [phone] who LMP was No LMP recorded. (Menstrual status: Oral contraceptives).  Subjective:   She presents today for her annual examination.  She receives her general medical care including antihypertensive medication and statin from Dr. Sabra Heck.  She presents today for her GYN exam. She has 2 complaints: She states her night sweats and most of her hot flashes are gone but she still has hot flashes 2-3 times per week.  She was inquiring at perhaps changing her dose. The second concern is that she has constipation and has tried some over-the-counter medications without success.    Hx: The following portions of the patient's history were reviewed and updated as appropriate:             She  has a past medical history of Hyperlipidemia, Hypertension, PVD (peripheral vascular disease) ([place]), and Raynaud disease. She does not have any pertinent problems on file. She  has a past surgical history that includes Spine surgery; Cervical laminectomy; Cholecystectomy; Mitral valve replacement; Tonsillectomy; and Colonoscopy with propofol (N/A, 08/22/2016). Her family history includes Diabetes in her father, mother, and sister; Stroke in her paternal grandmother. She  reports that she quit smoking about 21 years ago. Her smoking use included cigarettes. She has never used smokeless tobacco. She reports that she does not drink alcohol or use drugs. She has a current medication list which includes the following prescription(s): alprazolam, aspirin, bupropion, cyanocobalamin, estradiol-norethindrone, magnesium oxide, meloxicam, multivitamin, phentermine, pravastatin, estradiol-norethindrone, and lopreeza. She is allergic to ace inhibitors; erythromycin; and iodine.       Review of Systems:  Review of Systems  Constitutional: Denied constitutional symptoms, night sweats, recent illness, fatigue, fever, insomnia and weight loss.  Eyes: Denied eye symptoms, eye pain,  photophobia, vision change and visual disturbance.  Ears/Nose/Throat/Neck: Denied ear, nose, throat or neck symptoms, hearing loss, nasal discharge, sinus congestion and sore throat.  Cardiovascular: Denied cardiovascular symptoms, arrhythmia, chest pain/pressure, edema, exercise intolerance, orthopnea and palpitations.  Respiratory: Denied pulmonary symptoms, asthma, pleuritic pain, productive sputum, cough, dyspnea and wheezing.  Gastrointestinal: See HPI for additional information.  Genitourinary: Denied genitourinary symptoms including symptomatic vaginal discharge, pelvic relaxation issues, and urinary complaints.  Musculoskeletal: Denied musculoskeletal symptoms, stiffness, swelling, muscle weakness and myalgia.  Dermatologic: Denied dermatology symptoms, rash and scar.  Neurologic: Denied neurology symptoms, dizziness, headache, neck pain and syncope.  Psychiatric: Denied psychiatric symptoms, anxiety and depression.  Endocrine: See HPI for additional information.   Meds:   Current Outpatient Medications on File Prior to Visit  Medication Sig Dispense Refill  . ALPRAZolam (XANAX) 0.5 MG tablet Take 0.25 mg by mouth at bedtime as needed for anxiety.     Marland Kitchen aspirin 325 MG tablet Take 325 mg by mouth daily.    Marland Kitchen buPROPion (WELLBUTRIN XL) 150 MG 24 hr tablet Take 150 mg by mouth daily.    . cyanocobalamin (,VITAMIN B-12,) 1000 MCG/ML injection Inject 1,000 mcg into the muscle once.    Marland Kitchen estradiol-norethindrone (ACTIVELLA) 1-0.5 MG tablet Take 1 tablet by mouth daily.    . magnesium oxide (MAG-OX) 400 MG tablet Take 400 mg by mouth 2 (two) times daily.     . meloxicam (MOBIC) 15 MG tablet Take 7.5 mg by mouth.     . Multiple Vitamin (MULTIVITAMIN) tablet Take 1 tablet by mouth daily.    . phentermine 37.5 MG capsule Take 37.5 mg by mouth every morning.    . pravastatin (PRAVACHOL)  40 MG tablet Take 20 mg by mouth daily.     Marland Kitchen estradiol-norethindrone (ACTIVELLA) 1-0.5 MG tablet Take 1  tablet by mouth daily. 30 tablet 8  . LOPREEZA 1-0.5 MG tablet   7   No current facility-administered medications on file prior to visit.     Objective:     Vitals:   01/23/18 1451  BP: 118/71  Pulse: 77              Physical examination General NAD, Conversant  HEENT Atraumatic; Op clear with mmm.  Normo-cephalic. Pupils reactive. Anicteric sclerae  Thyroid/Neck Smooth without nodularity or enlargement. Normal ROM.  Neck Supple.  Skin No rashes, lesions or ulceration. Normal palpated skin turgor. No nodularity.  Breasts: No masses or discharge.  Symmetric.  No axillary adenopathy.  Lungs: Clear to auscultation.No rales or wheezes. Normal Respiratory effort, no retractions.  Heart: NSR.  No murmurs or rubs appreciated. No periferal edema  Abdomen: Soft.  Non-tender.  No masses.  No HSM. No hernia  Extremities: Moves all appropriately.  Normal ROM for age. No lymphadenopathy.  Neuro: Oriented to PPT.  Normal mood. Normal affect.     Pelvic:   Vulva: Normal appearance.  No lesions.  Vagina: No lesions or abnormalities noted.  Support: Normal pelvic support.  Urethra No masses tenderness or scarring.  Meatus Normal size without lesions or prolapse.  Cervix: Normal appearance.  No lesions.  Anus: Normal exam.  No lesions.  Perineum: Normal exam.  No lesions.        Bimanual   Uterus: Normal size.  Non-tender.  Mobile.  AV.  Adnexae: No masses.  Non-tender to palpation.  Cul-de-sac: Negative for abnormality.      Assessment:    [id number] Patient Active Problem List   Diagnosis Date Noted  . Peripheral vascular disease ([place]) 12/31/2016  . Raynaud's disease without gangrene 10/29/2016  . Tubular adenoma 10/29/2016  . Benign essential hypertension 04/25/2016  . Hyperlipidemia, mixed 04/25/2016     1. Women's annual routine gynecological examination   2. Symptomatic menopausal or female climacteric states        Plan:            1.  Basic Screening  Recommendations The basic screening recommendations for asymptomatic women were discussed with the patient during her visit.  The age-appropriate recommendations were discussed with her and the rational for the tests reviewed.  When I am informed by the patient that another primary care physician has previously obtained the age-appropriate tests and they are up-to-date, only outstanding tests are ordered and referrals given as necessary.  Abnormal results of tests will be discussed with her when all of her results are completed. Mammogram ordered Recommend Pap smear next year as her 11/18 Pap was both cytologically and HPV negative but her colposcopy 46-month prior showed koilocytic change. 2.  We discussed constipation in detail and several strategies including medications were discussed. 3.  Patient would like to change her medication to try to alleviate the 2-3 times per week hot flashes.  We will change to Estrace and Provera and discontinue the Activella.   Orders Orders Placed This Encounter  Procedures  . MM DIGITAL SCREENING BILATERAL    No orders of the defined types were placed in this encounter.       F/U  Return in about 1 year (around 01/24/2019) for Annual Physical.  Finis Bud, M.D. 01/23/2018 3:42 PM

## 2018-02-14 ENCOUNTER — Encounter: Payer: Self-pay | Admitting: Obstetrics and Gynecology

## 2018-02-14 ENCOUNTER — Ambulatory Visit (INDEPENDENT_AMBULATORY_CARE_PROVIDER_SITE_OTHER): Payer: BLUE CROSS/BLUE SHIELD | Admitting: Obstetrics and Gynecology

## 2018-02-14 VITALS — BP 143/90 | HR 81 | Ht 67.0 in | Wt 214.0 lb

## 2018-02-14 DIAGNOSIS — E669 Obesity, unspecified: Secondary | ICD-10-CM

## 2018-02-14 MED ORDER — CYANOCOBALAMIN 1000 MCG/ML IJ SOLN
1000.0000 ug | Freq: Once | INTRAMUSCULAR | Status: AC
  Administered 2018-02-14: 1000 ug via INTRAMUSCULAR

## 2018-02-14 NOTE — Progress Notes (Signed)
Pt is here for wt, bp check, b-12 inj She is having a problem with constipation, not a new problem for her, she hasnt had a BM in 6 days, states she "got an enema before leaving work: is going to use over weekend, she isnt taking her phentermine currently, will let me know about her constipation next week 02/24/18 wt- 214lb 01/17/18 wt- 209lb

## 2018-02-24 ENCOUNTER — Encounter: Payer: Self-pay | Admitting: Obstetrics and Gynecology

## 2018-02-25 ENCOUNTER — Other Ambulatory Visit: Payer: Self-pay

## 2018-02-25 ENCOUNTER — Encounter: Payer: Self-pay | Admitting: Obstetrics and Gynecology

## 2018-02-25 MED ORDER — LINACLOTIDE 145 MCG PO CAPS: 145.0000 ug | ORAL_CAPSULE | Freq: Every day | ORAL | 0 refills | Status: DC

## 2018-04-01 ENCOUNTER — Encounter: Payer: Self-pay | Admitting: Obstetrics and Gynecology

## 2018-04-01 ENCOUNTER — Other Ambulatory Visit: Payer: Self-pay | Admitting: Obstetrics and Gynecology

## 2018-04-01 MED ORDER — CYANOCOBALAMIN 1000 MCG/ML IJ SOLN: 1000.0000 ug | INTRAMUSCULAR | 1 refills | Status: DC

## 2018-04-17 ENCOUNTER — Ambulatory Visit
Admission: RE | Admit: 2018-04-17 | Discharge: 2018-04-17 | Disposition: A | Discharge status: Home / Self Care | Payer: BLUE CROSS/BLUE SHIELD | Source: Ambulatory Visit | Attending: Obstetrics and Gynecology | Admitting: Obstetrics and Gynecology

## 2018-04-17 DIAGNOSIS — Z01419 Encounter for gynecological examination (general) (routine) without abnormal findings: Secondary | ICD-10-CM | POA: Insufficient documentation

## 2018-05-01 DIAGNOSIS — M519 Unspecified thoracic, thoracolumbar and lumbosacral intervertebral disc disorder: Secondary | ICD-10-CM | POA: Insufficient documentation

## 2018-11-10 ENCOUNTER — Telehealth: Payer: Self-pay | Admitting: Obstetrics and Gynecology

## 2018-11-10 DIAGNOSIS — N951 Menopausal and female climacteric states: Secondary | ICD-10-CM

## 2018-11-10 MED ORDER — ESTRADIOL 1 MG PO TABS
1.5000 mg | ORAL_TABLET | Freq: Every day | ORAL | 0 refills | Status: DC
Start: 2018-11-10 — End: 2018-12-31

## 2018-11-10 MED ORDER — MEDROXYPROGESTERONE ACETATE 2.5 MG PO TABS: 2.5000 mg | ORAL_TABLET | Freq: Every day | ORAL | 0 refills | Status: DC

## 2018-11-10 NOTE — Telephone Encounter (Signed)
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

## 2018-11-10 NOTE — Telephone Encounter (Signed)
Prescription has been sent to the pharmacy to last until appointment in May. I have left message on patient voicemail.

## 2018-12-30 ENCOUNTER — Other Ambulatory Visit: Payer: Self-pay | Admitting: Obstetrics and Gynecology

## 2018-12-30 DIAGNOSIS — N951 Menopausal and female climacteric states: Secondary | ICD-10-CM

## 2019-01-27 ENCOUNTER — Encounter: Payer: BLUE CROSS/BLUE SHIELD | Admitting: Obstetrics and Gynecology

## 2019-03-10 ENCOUNTER — Other Ambulatory Visit: Payer: Self-pay | Admitting: *Deleted

## 2019-03-10 DIAGNOSIS — Z20822 Contact with and (suspected) exposure to covid-19: Secondary | ICD-10-CM

## 2019-03-16 LAB — NOVEL CORONAVIRUS, NAA: SARS-CoV-2, NAA: NOT DETECTED

## 2019-03-26 ENCOUNTER — Telehealth: Payer: Self-pay | Admitting: *Deleted

## 2019-03-26 NOTE — Telephone Encounter (Signed)
Pt called in saying she was returning a call from yesterday to call this number [phone] for her COVID-19 test results. "I have my test results from a week ago via [facility] and it was non-detected".    "Did y'all find something else?"    I checked her chart and confirmed her results were non-detected and it was one of the nurses from the [facility] calling her to make sure you knew your results.  She thanked me for checking.

## 2019-04-07 ENCOUNTER — Other Ambulatory Visit (HOSPITAL_COMMUNITY)
Admission: RE | Admit: 2019-04-07 | Payer: BLUE CROSS/BLUE SHIELD | Source: Ambulatory Visit | Admitting: Obstetrics and Gynecology

## 2019-04-07 ENCOUNTER — Other Ambulatory Visit: Payer: Self-pay

## 2019-04-07 ENCOUNTER — Encounter: Payer: Self-pay | Admitting: Obstetrics and Gynecology

## 2019-04-07 ENCOUNTER — Ambulatory Visit (INDEPENDENT_AMBULATORY_CARE_PROVIDER_SITE_OTHER): Payer: BLUE CROSS/BLUE SHIELD | Admitting: Obstetrics and Gynecology

## 2019-04-07 VITALS — BP 118/74 | HR 82 | Ht 66.0 in | Wt 205.1 lb

## 2019-04-07 DIAGNOSIS — N951 Menopausal and female climacteric states: Secondary | ICD-10-CM

## 2019-04-07 DIAGNOSIS — Z01419 Encounter for gynecological examination (general) (routine) without abnormal findings: Secondary | ICD-10-CM

## 2019-04-07 DIAGNOSIS — Z124 Encounter for screening for malignant neoplasm of cervix: Secondary | ICD-10-CM | POA: Diagnosis not present

## 2019-04-07 DIAGNOSIS — Z1231 Encounter for screening mammogram for malignant neoplasm of breast: Secondary | ICD-10-CM | POA: Diagnosis not present

## 2019-04-07 MED ORDER — MEDROXYPROGESTERONE ACETATE 2.5 MG PO TABS: 2.5000 mg | ORAL_TABLET | Freq: Every day | ORAL | 3 refills | Status: DC

## 2019-04-07 MED ORDER — ESTRADIOL 1 MG PO TABS: 1.5000 mg | ORAL_TABLET | Freq: Every day | ORAL | 3 refills | Status: DC

## 2019-04-07 NOTE — Progress Notes (Signed)
HPI:      [name] is a 53 y.o. [phone] who LMP was No LMP recorded (lmp unknown). Patient is postmenopausal.  Subjective:   She presents today for her annual examination.  She states that her new estrogen dosage much better and she feels the difference.  She would like to continue. Denies vaginal bleeding    Hx: The following portions of the patient's history were reviewed and updated as appropriate:             She  has a past medical history of Hyperlipidemia, Hypertension, PVD (peripheral vascular disease) ([place]), and Raynaud disease. She does not have any pertinent problems on file. She  has a past surgical history that includes Spine surgery; Cervical laminectomy; Cholecystectomy; Mitral valve replacement; Tonsillectomy; and Colonoscopy with propofol (N/A, 08/22/2016). Her family history includes Diabetes in her father, mother, and sister; Stroke in her paternal grandmother. She  reports that she quit smoking about 22 years ago. Her smoking use included cigarettes. She has never used smokeless tobacco. She reports that she does not drink alcohol or use drugs. She has a current medication list which includes the following prescription(s): alprazolam, aspirin, bisoprolol, bupropion, cyanocobalamin, cyanocobalamin, estradiol, magnesium oxide, medroxyprogesterone, meloxicam, multivitamin, phentermine, pravastatin, estradiol-norethindrone, estradiol-norethindrone, linaclotide, and lopreeza. She is allergic to ace inhibitors; erythromycin; and iodine.       Review of Systems:  Review of Systems  Constitutional: Denied constitutional symptoms, night sweats, recent illness, fatigue, fever, insomnia and weight loss.  Eyes: Denied eye symptoms, eye pain, photophobia, vision change and visual disturbance.  Ears/Nose/Throat/Neck: Denied ear, nose, throat or neck symptoms, hearing loss, nasal discharge, sinus congestion and sore throat.  Cardiovascular: Denied cardiovascular symptoms,  arrhythmia, chest pain/pressure, edema, exercise intolerance, orthopnea and palpitations.  Respiratory: Denied pulmonary symptoms, asthma, pleuritic pain, productive sputum, cough, dyspnea and wheezing.  Gastrointestinal: Denied, gastro-esophageal reflux, melena, nausea and vomiting.  Genitourinary: Denied genitourinary symptoms including symptomatic vaginal discharge, pelvic relaxation issues, and urinary complaints.  Musculoskeletal: Denied musculoskeletal symptoms, stiffness, swelling, muscle weakness and myalgia.  Dermatologic: Denied dermatology symptoms, rash and scar.  Neurologic: Denied neurology symptoms, dizziness, headache, neck pain and syncope.  Psychiatric: Denied psychiatric symptoms, anxiety and depression.  Endocrine: Denied endocrine symptoms including hot flashes and night sweats.   Meds:   Current Outpatient Medications on File Prior to Visit  Medication Sig Dispense Refill  . ALPRAZolam (XANAX) 0.5 MG tablet Take 0.25 mg by mouth at bedtime as needed for anxiety.     Marland Kitchen aspirin 325 MG tablet Take 325 mg by mouth daily.    . bisoprolol (ZEBETA) 10 MG tablet Take by mouth.    Marland Kitchen buPROPion (WELLBUTRIN XL) 150 MG 24 hr tablet Take 150 mg by mouth daily.    . cyanocobalamin (,VITAMIN B-12,) 1000 MCG/ML injection Inject 1,000 mcg into the muscle once.    . cyanocobalamin (,VITAMIN B-12,) 1000 MCG/ML injection Inject 1 mL (1,000 mcg total) into the muscle every 30 (thirty) days. 10 mL 1  . magnesium oxide (MAG-OX) 400 MG tablet Take 400 mg by mouth 2 (two) times daily.     . meloxicam (MOBIC) 15 MG tablet Take 7.5 mg by mouth.     . Multiple Vitamin (MULTIVITAMIN) tablet Take 1 tablet by mouth daily.    . phentermine 37.5 MG capsule Take 37.5 mg by mouth every morning.    . pravastatin (PRAVACHOL) 40 MG tablet Take 40 mg by mouth daily.     Marland Kitchen estradiol-norethindrone (ACTIVELLA) 1-0.5 MG  tablet Take 1 tablet by mouth daily. 30 tablet 8  . estradiol-norethindrone (ACTIVELLA) 1-0.5  MG tablet Take 1 tablet by mouth daily.    Marland Kitchen linaclotide (LINZESS) 145 MCG CAPS capsule Take 1 capsule (145 mcg total) by mouth daily before breakfast. 90 capsule 0  . LOPREEZA 1-0.5 MG tablet   7   No current facility-administered medications on file prior to visit.     Objective:     There were no vitals filed for this visit.            Physical examination General NAD, Conversant  HEENT Atraumatic; Op clear with mmm.  Normo-cephalic. Pupils reactive. Anicteric sclerae  Thyroid/Neck Smooth without nodularity or enlargement. Normal ROM.  Neck Supple.  Skin No rashes, lesions or ulceration. Normal palpated skin turgor. No nodularity.  Breasts: No masses or discharge.  Symmetric.  No axillary adenopathy.  Lungs: Clear to auscultation.No rales or wheezes. Normal Respiratory effort, no retractions.  Heart: NSR.  No murmurs or rubs appreciated. No periferal edema  Abdomen: Soft.  Non-tender.  No masses.  No HSM. No hernia  Extremities: Moves all appropriately.  Normal ROM for age. No lymphadenopathy.  Neuro: Oriented to PPT.  Normal mood. Normal affect.     Pelvic:   Vulva: Normal appearance.  No lesions.  Vagina: No lesions or abnormalities noted.  Support: Normal pelvic support.  Urethra No masses tenderness or scarring.  Meatus Normal size without lesions or prolapse.  Cervix: Normal appearance.  No lesions.  Anus: Normal exam.  No lesions.  Perineum: Normal exam.  No lesions.        Bimanual   Uterus: Normal size.  Non-tender.  Mobile.  AV.  Adnexae: No masses.  Non-tender to palpation.  Cul-de-sac: Negative for abnormality.      Assessment:    [id number] Patient Active Problem List   Diagnosis Date Noted  . Peripheral vascular disease ([place]) 12/31/2016  . Raynaud's disease without gangrene 10/29/2016  . Tubular adenoma 10/29/2016  . Benign essential hypertension 04/25/2016  . Hyperlipidemia, mixed 04/25/2016     1. Encounter for screening mammogram for breast cancer    2. Well woman exam with routine gynecological exam   3. Symptomatic menopausal or female climacteric states     Patient doing better on a slightly higher dose of ERT.  She would like to continue.    Plan:            1.  Basic Screening Recommendations The basic screening recommendations for asymptomatic women were discussed with the patient during her visit.  The age-appropriate recommendations were discussed with her and the rational for the tests reviewed.  When I am informed by the patient that another primary care physician has previously obtained the age-appropriate tests and they are up-to-date, only outstanding tests are ordered and referrals given as necessary.  Abnormal results of tests will be discussed with her when all of her results are completed. Pap performed especially in light of her colposcopy showing koilocytic change. Mammogram ordered Continue current dosing of HRT. Orders Orders Placed This Encounter  Procedures  . MM 3D SCREEN BREAST BILATERAL     Meds ordered this encounter  Medications  . estradiol (ESTRACE) 1 MG tablet    Sig: Take 1.5 tablets (1.5 mg total) by mouth daily.    Dispense:  135 tablet    Refill:  3    PT HAD APPT SCHEDULED BUT MD CANCELLED TIL MID-JUNE.CAN YOUPLEASE SEND A NEW RX TO GET HER  THROUGH UNTIL THEN.  . medroxyPROGESTERone (PROVERA) 2.5 MG tablet    Sig: Take 1 tablet (2.5 mg total) by mouth daily.    Dispense:  90 tablet    Refill:  3    PT HAD APPT SCHEDULED BUT MD CANCELLED UNTIL MID-JUNE. CANYOU PLEASE SEND IN A NEW RX TO GET HER THROUGH UNTIL THEN        F/U  Return in about 1 year (around 04/06/2020) for Annual Physical.  Finis Bud, M.D. 04/07/2019 4:03 PM

## 2019-04-07 NOTE — Progress Notes (Signed)
                                   Patient comes in today for annual exam. She has no concerns today. She is due for mammogram.

## 2019-04-07 NOTE — Addendum Note (Signed)
Addended by: Durwin Glaze on: 04/07/2019 04:07 PM   Modules accepted: Orders

## 2019-04-13 LAB — CYTOLOGY - PAP
Diagnosis: NEGATIVE
HPV: NOT DETECTED

## 2019-04-21 ENCOUNTER — Encounter (INDEPENDENT_AMBULATORY_CARE_PROVIDER_SITE_OTHER): Payer: Self-pay | Admitting: Vascular Surgery

## 2019-04-21 ENCOUNTER — Other Ambulatory Visit: Payer: Self-pay

## 2019-04-21 ENCOUNTER — Ambulatory Visit (INDEPENDENT_AMBULATORY_CARE_PROVIDER_SITE_OTHER): Payer: BLUE CROSS/BLUE SHIELD | Admitting: Vascular Surgery

## 2019-04-21 ENCOUNTER — Ambulatory Visit (INDEPENDENT_AMBULATORY_CARE_PROVIDER_SITE_OTHER): Payer: BLUE CROSS/BLUE SHIELD

## 2019-04-21 ENCOUNTER — Other Ambulatory Visit (INDEPENDENT_AMBULATORY_CARE_PROVIDER_SITE_OTHER): Payer: Self-pay | Admitting: Vascular Surgery

## 2019-04-21 VITALS — BP 153/85 | HR 78 | Resp 12 | Ht 66.0 in | Wt 204.0 lb

## 2019-04-21 DIAGNOSIS — I7300 Raynaud's syndrome without gangrene: Secondary | ICD-10-CM | POA: Diagnosis not present

## 2019-04-21 DIAGNOSIS — I739 Peripheral vascular disease, unspecified: Secondary | ICD-10-CM

## 2019-04-21 DIAGNOSIS — M79604 Pain in right leg: Secondary | ICD-10-CM

## 2019-04-21 DIAGNOSIS — I10 Essential (primary) hypertension: Secondary | ICD-10-CM | POA: Diagnosis not present

## 2019-04-21 DIAGNOSIS — E782 Mixed hyperlipidemia: Secondary | ICD-10-CM | POA: Diagnosis not present

## 2019-04-21 DIAGNOSIS — M79609 Pain in unspecified limb: Secondary | ICD-10-CM | POA: Insufficient documentation

## 2019-04-21 DIAGNOSIS — M79605 Pain in left leg: Secondary | ICD-10-CM

## 2019-04-21 NOTE — Assessment & Plan Note (Signed)
lipid control important in reducing the progression of atherosclerotic disease. Continue statin therapy  

## 2019-04-21 NOTE — Assessment & Plan Note (Signed)
She is studied with noninvasive studies today which showed normal triphasic waveforms and digital flow bilaterally with ABIs of 1.11 on the right and 1.08 on the left.  At this point, her flow is well maintained.  There could be a component of atheroembolization from her previous stents.  She was recently started on Plavix and if that is the case with normal perfusion that would be the initial treatment for several months.  We are also going to perform a venous work-up as described below.

## 2019-04-21 NOTE — Assessment & Plan Note (Signed)
She is studied with noninvasive studies today which showed normal triphasic waveforms and digital flow bilaterally with ABIs of 1.11 on the right and 1.08 on the left.   Her flow is likely not from arterial insufficiency, but atheroembolization is still within the differential diagnosis.  Plavix has been started and with normal perfusion that would be the preferred regimen for treatment of that.  I think venous disease may be playing a role as well.  Her varicosities have become more prominent and some of the discoloration symptoms may be from venous stasis.  There may also be a neuropathic component present if her venous work-up is unrevealing.  A venous reflux study will be done in the near future at her convenience.

## 2019-04-21 NOTE — Assessment & Plan Note (Signed)
Some the discoloration may be related to Raynaud's changes although she has not had a lot of cold stimuli at this point.

## 2019-04-21 NOTE — Progress Notes (Signed)
Patient ID: Pamela Madden, female   DOB: [birth year], 53 y.o.   MRN: [id number]  Chief Complaint  Patient presents with   Follow-up    HPI Pamela Madden is a 53 y.o. female.  I am asked to see the patient by Dr. Hall Busing for evaluation of PAD.  The patient had iliac intervention some 22 years ago when she was in her 53s.  Since that time, her claudication symptoms are markedly improved.  She said because of her young age and the unusual diagnosis it took years before they diagnosed the problem and fixed it.  Over the past several weeks, she has noticed some increasing pain in her legs.  There is a pins and needle sensation in the feet and lower legs as well as some purplish discoloration which has worsened in her toes particularly in the right leg.  After discussions with her primary care physician, she was started on Plavix yesterday.  As of yet that has not made a major difference.  She is also noticing some increasing varicosities in the legs.  Not really a lot of swelling but there is more discoloration in both feet and lower leg areas.  No trauma or injury.  No clear inciting event that worsen the symptoms.  She is studied with noninvasive studies today which showed normal triphasic waveforms and digital flow bilaterally with ABIs of 1.11 on the right and 1.08 on the left.     Past Medical History:  Diagnosis Date   Hyperlipidemia    Hypertension    PVD (peripheral vascular disease) ([place])    Raynaud disease     Past Surgical History:  Procedure Laterality Date   CERVICAL LAMINECTOMY     CHOLECYSTECTOMY     COLONOSCOPY WITH PROPOFOL N/A 08/22/2016   Procedure: COLONOSCOPY WITH PROPOFOL;  Surgeon: Manya Silvas, MD;  Location: [facility];  Service: Endoscopy;  Laterality: N/A;   PTCA     SPINE SURGERY     TONSILLECTOMY      Family History Family History  Problem Relation Age of Onset   Diabetes Mother    Diabetes Father    Diabetes Sister    Stroke  Paternal Grandmother     Social History Social History   Tobacco Use   Smoking status: Former Smoker    Types: Cigarettes    Quit date: 1998    Years since quitting: 22.6   Smokeless tobacco: Never Used  Substance Use Topics   Alcohol use: No   Drug use: No    Allergies  Allergen Reactions   Ace Inhibitors    Erythromycin    Iodine     Current Outpatient Medications  Medication Sig Dispense Refill   ALPRAZolam (XANAX) 0.5 MG tablet Take 0.25 mg by mouth at bedtime as needed for anxiety.      aspirin 325 MG tablet Take 325 mg by mouth daily.     bisoprolol (ZEBETA) 10 MG tablet Take by mouth.     buPROPion (WELLBUTRIN XL) 150 MG 24 hr tablet Take 150 mg by mouth daily.     clopidogrel (PLAVIX) 75 MG tablet      Coenzyme Q10-Vitamin E (COQ10 ST-100 PO) Take by mouth.     cyanocobalamin (,VITAMIN B-12,) 1000 MCG/ML injection Inject 5,000 mcg into the muscle once.      estradiol (ESTRACE) 1 MG tablet Take 1.5 tablets (1.5 mg total) by mouth daily. 135 tablet 3   magnesium oxide (MAG-OX) 400 MG tablet Take  400 mg by mouth 2 (two) times daily.      medroxyPROGESTERone (PROVERA) 2.5 MG tablet Take 1 tablet (2.5 mg total) by mouth daily. 90 tablet 3   Melatonin 10 MG CAPS Take by mouth.     Multiple Vitamin (MULTIVITAMIN) tablet Take 1 tablet by mouth daily.     Omega-3 Fatty Acids (FISH OIL) 1200 MG CPDR Take by mouth.     phentermine 37.5 MG capsule Take 37.5 mg by mouth every morning.     pravastatin (PRAVACHOL) 40 MG tablet Take 40 mg by mouth daily.      Turmeric (QC TUMERIC COMPLEX PO) Take by mouth.     cyanocobalamin (,VITAMIN B-12,) 1000 MCG/ML injection Inject 1 mL (1,000 mcg total) into the muscle every 30 (thirty) days. 10 mL 1   estradiol-norethindrone (ACTIVELLA) 1-0.5 MG tablet Take 1 tablet by mouth daily. (Patient not taking: Reported on 04/21/2019) 30 tablet 8   estradiol-norethindrone (ACTIVELLA) 1-0.5 MG tablet Take 1 tablet by mouth  daily.     linaclotide (LINZESS) 145 MCG CAPS capsule Take 1 capsule (145 mcg total) by mouth daily before breakfast. 90 capsule 0   LOPREEZA 1-0.5 MG tablet   7   meloxicam (MOBIC) 15 MG tablet Take 7.5 mg by mouth.      No current facility-administered medications for this visit.       REVIEW OF SYSTEMS (Negative unless checked)  Constitutional: [] Weight loss  [] Fever  [] Chills Cardiac: [] Chest pain   [] Chest pressure   [] Palpitations   [] Shortness of breath when laying flat   [] Shortness of breath at rest   [] Shortness of breath with exertion. Vascular:  [x] Pain in legs with walking   [] Pain in legs at rest   [] Pain in legs when laying flat   [x] Claudication   [] Pain in feet when walking  [] Pain in feet at rest  [] Pain in feet when laying flat   [] History of DVT   [] Phlebitis   [] Swelling in legs   [x] Varicose veins   [] Non-healing ulcers Pulmonary:   [] Uses home oxygen   [] Productive cough   [] Hemoptysis   [] Wheeze  [] COPD   [] Asthma Neurologic:  [] Dizziness  [] Blackouts   [] Seizures   [] History of stroke   [] History of TIA  [] Aphasia   [] Temporary blindness   [] Dysphagia   [] Weakness or numbness in arms   [] Weakness or numbness in legs Musculoskeletal:  [x] Arthritis   [] Joint swelling   [] Joint pain   [] Low back pain Hematologic:  [] Easy bruising  [] Easy bleeding   [] Hypercoagulable state   [] Anemic  [] Hepatitis Gastrointestinal:  [] Blood in stool   [] Vomiting blood  [] Gastroesophageal reflux/heartburn   [] Abdominal pain Genitourinary:  [] Chronic kidney disease   [] Difficult urination  [] Frequent urination  [] Burning with urination   [] Hematuria Skin:  [] Rashes   [] Ulcers   [] Wounds Psychological:  [] History of anxiety   []  History of major depression.    Physical Exam BP (!) 153/85 (BP Location: Left Arm, Patient Position: Sitting, Cuff Size: Normal)    Pulse 78    Resp 12    Ht 5\' 6"  (1.676 m)    Wt 204 lb (92.5 kg)    LMP  (LMP Unknown)    BMI 32.93 kg/m  Gen:  WD/WN,  NAD Head: [place]/AT, No temporalis wasting. Prominent temp pulse not noted. Ear/Nose/Throat: Hearing grossly intact, nares w/o erythema or drainage, oropharynx w/o Erythema/Exudate Eyes: Conjunctiva clear, sclera non-icteric  Neck: trachea midline.  No JVD.  Pulmonary:  Good air  movement, respirations not labored, no use of accessory muscles  Cardiac: RRR, no JVD Vascular:  Vessel Right Left  Radial Palpable Palpable                          DP  palpable  palpable  PT  palpable  palpable   Gastrointestinal:. No masses, surgical incisions, or scars. Musculoskeletal: M/S 5/5 throughout.  Extremities without ischemic changes.  No deformity or atrophy.  Gatter to diffuse varicosities present bilaterally.  There is some purplish discoloration particularly in the toes and foot on the right a little more so than the left.  No significant lower extremity edema. Neurologic: Sensation grossly intact in extremities.  Symmetrical.  Speech is fluent. Motor exam as listed above. Psychiatric: Judgment intact, Mood & affect appropriate for pt's clinical situation. Dermatologic: No rashes or ulcers noted.  No cellulitis or open wounds.    Radiology Vas [place] Abi With/wo Tbi  Result Date: 04/21/2019 LOWER EXTREMITY DOPPLER STUDY Indications: Peripheral artery disease, and Left iliac stent 1998.  Comparison Study: none Performing Technologist: Concha Norway RVT  Examination Guidelines: A complete evaluation includes at minimum, Doppler waveform signals and systolic blood pressure reading at the level of bilateral brachial, anterior tibial, and posterior tibial arteries, when vessel segments are accessible. Bilateral testing is considered an integral part of a complete examination. Photoelectric Plethysmograph (PPG) waveforms and toe systolic pressure readings are included as required and additional duplex testing as needed. Limited examinations for reoccurring indications may be performed as noted.  ABI Findings:  +---------+------------------+-----+---------+--------+  Right     Rt Pressure (mmHg) Index Waveform  Comment   +---------+------------------+-----+---------+--------+  Brachial  130                                          +---------+------------------+-----+---------+--------+  ATA       141                1.08  triphasic           +---------+------------------+-----+---------+--------+  PTA       144                1.11  triphasic           +---------+------------------+-----+---------+--------+  Great Toe 124                0.95  Normal              +---------+------------------+-----+---------+--------+ +---------+------------------+-----+---------+-------+  Left      Lt Pressure (mmHg) Index Waveform  Comment  +---------+------------------+-----+---------+-------+  Brachial  130                                         +---------+------------------+-----+---------+-------+  ATA       138                1.06  triphasic          +---------+------------------+-----+---------+-------+  PTA       141                1.08  triphasic          +---------+------------------+-----+---------+-------+  Great Toe 94  0.72  Normal             +---------+------------------+-----+---------+-------+ +-------+-----------+-----------+------------+------------+  ABI/TBI Today's ABI Today's TBI Previous ABI Previous TBI  +-------+-----------+-----------+------------+------------+  Right   1.11        .95                                    +-------+-----------+-----------+------------+------------+  Left    1.08        .72                                    +-------+-----------+-----------+------------+------------+  Summary: Right: Resting right ankle-brachial index is within normal range. No evidence of significant right lower extremity arterial disease. Normal pressure, mildly diminished waveform. History of reynauds disease. Left: Resting left ankle-brachial index is within normal range. No evidence of significant  left lower extremity arterial disease.  *See table(s) above for measurements and observations.  Electronically signed by Leotis Pain MD on 04/21/2019 at 12:05:20 PM.   Final     Labs Recent Results (from the past 2160 hour(s))  Novel Coronavirus, NAA ([facility])     Status: None   Collection Time: 03/10/19 12:00 AM  Result Value Ref Range   SARS-CoV-2, NAA Not Detected Not Detected    Comment: Testing was performed using the cobas(R) SARS-CoV-2 test. This test was developed and its performance characteristics determined by [employer]. This test has not been FDA cleared or approved. This test has been authorized by FDA under an Emergency Use Authorization (EUA). This test is only authorized for the duration of time the declaration that circumstances exist justifying the authorization of the emergency use of in vitro diagnostic tests for detection of SARS-CoV-2 virus and/or diagnosis of COVID-19 infection under section 564(b)(1) of the Act, 21 U.S.C. 749SWH-6(P)(5), unless the authorization is terminated or revoked sooner. When diagnostic testing is negative, the possibility of a false negative result should be considered in the context of a patient's recent exposures and the presence of clinical signs and symptoms consistent with COVID-19. An individual without symptoms of COVID-19 and who is not shedding SARS-CoV-2 virus would expect to have a negati ve (not detected) result in this assay.   Cytology - PAP     Status: None   Collection Time: 04/07/19 12:00 AM  Result Value Ref Range   Adequacy      Satisfactory for evaluation  endocervical/transformation zone component PRESENT.   Diagnosis      NEGATIVE FOR INTRAEPITHELIAL LESIONS OR MALIGNANCY.   HPV NOT Detected     Comment: Normal Reference Range - NOT Detected   Material Submitted CervicoVaginal Pap [ThinPrep Imaged]    CYTOLOGY - PAP PAP RESULT     Assessment/Plan:  Hyperlipidemia, mixed lipid control important in  reducing the progression of atherosclerotic disease. Continue statin therapy   Benign essential hypertension blood pressure control important in reducing the progression of atherosclerotic disease. On appropriate oral medications.   Raynaud's disease without gangrene Some the discoloration may be related to Raynaud's changes although she has not had a lot of cold stimuli at this point.  Peripheral vascular disease ([place]) She is studied with noninvasive studies today which showed normal triphasic waveforms and digital flow bilaterally with ABIs of 1.11 on the right and 1.08 on the left.  At this point, her flow is well maintained.  There could be a component of atheroembolization from her previous stents.  She was recently started on Plavix and if that is the case with normal perfusion that would be the initial treatment for several months.  We are also going to perform a venous work-up as described below.  Pain in limb She is studied with noninvasive studies today which showed normal triphasic waveforms and digital flow bilaterally with ABIs of 1.11 on the right and 1.08 on the left.   Her flow is likely not from arterial insufficiency, but atheroembolization is still within the differential diagnosis.  Plavix has been started and with normal perfusion that would be the preferred regimen for treatment of that.  I think venous disease may be playing a role as well.  Her varicosities have become more prominent and some of the discoloration symptoms may be from venous stasis.  There may also be a neuropathic component present if her venous work-up is unrevealing.  A venous reflux study will be done in the near future at her convenience.      Leotis Pain 04/21/2019, 3:11 PM   This note was created with Dragon medical transcription system.  Any errors from dictation are unintentional.

## 2019-04-21 NOTE — Assessment & Plan Note (Signed)
blood pressure control important in reducing the progression of atherosclerotic disease. On appropriate oral medications.  

## 2019-04-24 ENCOUNTER — Ambulatory Visit (INDEPENDENT_AMBULATORY_CARE_PROVIDER_SITE_OTHER): Payer: BLUE CROSS/BLUE SHIELD

## 2019-04-24 ENCOUNTER — Encounter (INDEPENDENT_AMBULATORY_CARE_PROVIDER_SITE_OTHER): Payer: Self-pay | Admitting: Nurse Practitioner

## 2019-04-24 ENCOUNTER — Ambulatory Visit (INDEPENDENT_AMBULATORY_CARE_PROVIDER_SITE_OTHER): Payer: BLUE CROSS/BLUE SHIELD | Admitting: Nurse Practitioner

## 2019-04-24 ENCOUNTER — Other Ambulatory Visit: Payer: Self-pay

## 2019-04-24 VITALS — BP 125/85 | HR 90 | Resp 12 | Ht 66.0 in | Wt 205.0 lb

## 2019-04-24 DIAGNOSIS — I10 Essential (primary) hypertension: Secondary | ICD-10-CM | POA: Diagnosis not present

## 2019-04-24 DIAGNOSIS — M79605 Pain in left leg: Secondary | ICD-10-CM

## 2019-04-24 DIAGNOSIS — E782 Mixed hyperlipidemia: Secondary | ICD-10-CM | POA: Diagnosis not present

## 2019-04-24 DIAGNOSIS — I739 Peripheral vascular disease, unspecified: Secondary | ICD-10-CM

## 2019-04-24 DIAGNOSIS — I872 Venous insufficiency (chronic) (peripheral): Secondary | ICD-10-CM | POA: Diagnosis not present

## 2019-04-24 DIAGNOSIS — M79604 Pain in right leg: Secondary | ICD-10-CM

## 2019-05-03 DIAGNOSIS — I872 Venous insufficiency (chronic) (peripheral): Secondary | ICD-10-CM | POA: Insufficient documentation

## 2019-05-03 NOTE — Progress Notes (Signed)
SUBJECTIVE:  Patient ID: Pamela Madden, female    DOB: Jun 10, 1966, 53 y.o.   MRN: [id number] Chief Complaint  Patient presents with  . Follow-up    HPI  Pamela Madden is a 53 y.o. female that presents today for follow-up with her bloating, pins and needle sensation in her lower feet as well as the purplish discoloration that is worsening.  The patient states that since her primary care provider put her on Plavix and since she was here last week, the pins-and-needles sensation has lessened although it is still somewhat present and the discoloration is less.  She denies any claudication-like symptoms.  She denies any fever, chills, nausea, vomiting or diarrhea.  Previous ABIs showed ABIs within normal limits as well as triphasic waveforms.  The patient is also been having some swelling which may have caused some of the discomfort and discoloration.  Today she underwent noninvasive studies which showed reflux within the right lower extremity at the great saphenous vein.  Otherwise there is no evidence of DVT or superficial venous thrombosis bilaterally.  There is no evidence of chronic venous insufficiency in the left lower extremity.  Past Medical History:  Diagnosis Date  . Hyperlipidemia   . Hypertension   . PVD (peripheral vascular disease) ([place])   . Raynaud disease     Past Surgical History:  Procedure Laterality Date  . CERVICAL LAMINECTOMY    . CHOLECYSTECTOMY    . COLONOSCOPY WITH PROPOFOL N/A 08/22/2016   Procedure: COLONOSCOPY WITH PROPOFOL;  Surgeon: Manya Silvas, MD;  Location[facility] ENDOSCOPY;  Service: Endoscopy;  Laterality: N/A;  . PTCA    . SPINE SURGERY    . TONSILLECTOMY      Social History   Socioeconomic History  . Marital status: Single    Spouse name: Not on file  . Number of children: Not on file  . Years of education: Not on file  . Highest education level: Not on file  Occupational History  . Not on file  Social Needs  . Financial resource  strain: Not on file  . Food insecurity    Worry: Not on file    Inability: Not on file  . Transportation needs    Medical: Not on file    Non-medical: Not on file  Tobacco Use  . Smoking status: Former Smoker    Types: Cigarettes    Quit date: 1998    Years since quitting: 22.6  . Smokeless tobacco: Never Used  Substance and Sexual Activity  . Alcohol use: No  . Drug use: No  . Sexual activity: Yes    Birth control/protection: None  Lifestyle  . Physical activity    Days per week: Not on file    Minutes per session: Not on file  . Stress: Not on file  Relationships  . Social Herbalist on phone: Not on file    Gets together: Not on file    Attends religious service: Not on file    Active member of club or organization: Not on file    Attends meetings of clubs or organizations: Not on file    Relationship status: Not on file  . Intimate partner violence    Fear of current or ex partner: Not on file    Emotionally abused: Not on file    Physically abused: Not on file    Forced sexual activity: Not on file  Other Topics Concern  . Not on file  Social  History Narrative  . Not on file    Family History  Problem Relation Age of Onset  . Diabetes Mother   . Diabetes Father   . Diabetes Sister   . Stroke Paternal Grandmother     Allergies  Allergen Reactions  . Ace Inhibitors   . Erythromycin   . Iodine      Review of Systems   Review of Systems: Negative Unless Checked Constitutional: [] Weight loss  [] Fever  [] Chills Cardiac: [] Chest pain   []  Atrial Fibrillation  [] Palpitations   [] Shortness of breath when laying flat   [] Shortness of breath with exertion. [] Shortness of breath at rest Vascular:  [] Pain in legs with walking   [] Pain in legs with standing [] Pain in legs when laying flat   [] Claudication    [] Pain in feet when laying flat    [] History of DVT   [] Phlebitis   [x] Swelling in legs   [x] Varicose veins   [] Non-healing ulcers Pulmonary:    [] Uses home oxygen   [] Productive cough   [] Hemoptysis   [] Wheeze  [] COPD   [] Asthma Neurologic:  [] Dizziness   [] Seizures  [] Blackouts [] History of stroke   [] History of TIA  [] Aphasia   [] Temporary Blindness   [] Weakness or numbness in arm   [x] Weakness or numbness in leg Musculoskeletal:   [] Joint swelling   [] Joint pain   [] Low back pain  []  History of Knee Replacement [] Arthritis [] back Surgeries  []  Spinal Stenosis    Hematologic:  [] Easy bruising  [] Easy bleeding   [] Hypercoagulable state   [] Anemic Gastrointestinal:  [] Diarrhea   [] Vomiting  [] Gastroesophageal reflux/heartburn   [] Difficulty swallowing. [] Abdominal pain Genitourinary:  [] Chronic kidney disease   [] Difficult urination  [] Anuric   [] Blood in urine [] Frequent urination  [] Burning with urination   [] Hematuria Skin:  [] Rashes   [] Ulcers [] Wounds Psychological:  [] History of anxiety   []  History of major depression  []  Memory Difficulties      OBJECTIVE:   Physical Exam  BP 125/85 (BP Location: Left Arm, Patient Position: Sitting, Cuff Size: Large)   Pulse 90   Resp 12   Ht 5\' 6"  (1.676 m)   Wt 205 lb (93 kg)   LMP  (LMP Unknown)   BMI 33.09 kg/m   Gen: WD/WN, NAD Head: [place]/AT, No temporalis wasting.  Ear/Nose/Throat: Hearing grossly intact, nares w/o erythema or drainage Eyes: PER, EOMI, sclera nonicteric.  Neck: Supple, no masses.  No JVD.  Pulmonary:  Good air movement, no use of accessory muscles.  Cardiac: RRR Vascular: RLE scattered varicosities Vessel Right Left  Radial Palpable Palpable  Dorsalis Pedis Palpable Palpable  Posterior Tibial Palpable Palpable   Gastrointestinal: soft, non-distended. No guarding/no peritoneal signs.  Musculoskeletal: M/S 5/5 throughout.  No deformity or atrophy.  Neurologic: Pain and light touch intact in extremities.  Symmetrical.  Speech is fluent. Motor exam as listed above. Psychiatric: Judgment intact, Mood & affect appropriate for pt's clinical situation.  Dermatologic: No Venous rashes. No Ulcers Noted.  No changes consistent with cellulitis. Lymph : No Cervical lymphadenopathy, no lichenification or skin changes of chronic lymphedema.       ASSESSMENT AND PLAN:  1. PAD (peripheral artery disease) (HCC) We will have the patient return in 3 months for noninvasive studies.  Patient has not had a look at her stent in some time and it was placed over 22 years ago.  We will do an aortoiliac duplex in order to establish a baseline.  We will also repeat ABIs in  order to see how her symptoms have subsided with Plavix usage.  Patient also states that she has had her carotid scan before in the past however it has not been done recently.  We will also do a carotid duplex.  Patient will follow-up in office in 3 months. - VAS US CAROTID; Future - VAS [place] ABI WITH/WO TBI; Future - VAS US AORTA/IVC/ILIACS; Future  2. Benign essential hypertension Continue antihypertensive medications as already ordered, these medications have been reviewed and there are no changes at this time.   3. Hyperlipidemia, mixed Continue statin as ordered and reviewed, no changes at this time   4. Chronic venous insufficiency Recommend:  The patient is complaining of varicose veins.    I have had a long discussion with the patient regarding  varicose veins and why they cause symptoms.  Patient will begin wearing graduated compression stockings on a daily basis, beginning first thing in the morning and removing them in the evening. The patient is instructed specifically not to sleep in the stockings.    The patient  will also begin using over-the-counter analgesics such as Motrin 600 mg po TID to help control the symptoms as needed.    In addition, behavioral modification including elevation during the day will be initiated, utilizing a recliner was recommended.  The patient is also instructed to continue exercising such as walking 4-5 times per week.  At this time the  patient wishes to continue conservative therapy and is not interested in more invasive treatments such as laser ablation and sclerotherapy.  The Patient will follow up PRN if the symptoms worsen.   Current Outpatient Medications on File Prior to Visit  Medication Sig Dispense Refill  . ALPRAZolam (XANAX) 0.5 MG tablet Take 0.25 mg by mouth at bedtime as needed for anxiety.     Marland Kitchen aspirin 325 MG tablet Take 325 mg by mouth daily.    . bisoprolol (ZEBETA) 10 MG tablet Take by mouth.    Marland Kitchen buPROPion (WELLBUTRIN XL) 150 MG 24 hr tablet Take 150 mg by mouth daily.    . clopidogrel (PLAVIX) 75 MG tablet     . Coenzyme Q10-Vitamin E (COQ10 ST-100 PO) Take by mouth.    . cyanocobalamin (,VITAMIN B-12,) 1000 MCG/ML injection Inject 5,000 mcg into the muscle once.     . cyanocobalamin (,VITAMIN B-12,) 1000 MCG/ML injection Inject 1 mL (1,000 mcg total) into the muscle every 30 (thirty) days. 10 mL 1  . estradiol (ESTRACE) 1 MG tablet Take 1.5 tablets (1.5 mg total) by mouth daily. 135 tablet 3  . magnesium oxide (MAG-OX) 400 MG tablet Take 400 mg by mouth 2 (two) times daily.     . medroxyPROGESTERone (PROVERA) 2.5 MG tablet Take 1 tablet (2.5 mg total) by mouth daily. 90 tablet 3  . Melatonin 10 MG CAPS Take by mouth.    . meloxicam (MOBIC) 15 MG tablet Take 7.5 mg by mouth.     . Multiple Vitamin (MULTIVITAMIN) tablet Take 1 tablet by mouth daily.    . Omega-3 Fatty Acids (FISH OIL) 1200 MG CPDR Take by mouth.    . phentermine 37.5 MG capsule Take 37.5 mg by mouth every morning.    . pravastatin (PRAVACHOL) 40 MG tablet Take 40 mg by mouth daily.     . Turmeric (QC TUMERIC COMPLEX PO) Take by mouth.    . estradiol-norethindrone (ACTIVELLA) 1-0.5 MG tablet Take 1 tablet by mouth daily. (Patient not taking: Reported on 04/21/2019) 30 tablet 8  .  estradiol-norethindrone (ACTIVELLA) 1-0.5 MG tablet Take 1 tablet by mouth daily.    Marland Kitchen linaclotide (LINZESS) 145 MCG CAPS capsule Take 1 capsule (145 mcg  total) by mouth daily before breakfast. 90 capsule 0  . LOPREEZA 1-0.5 MG tablet   7   No current facility-administered medications on file prior to visit.     There are no Patient Instructions on file for this visit. No follow-ups on file.   Kris Hartmann, NP  This note was completed with Sales executive.  Any errors are purely unintentional.

## 2019-05-05 ENCOUNTER — Encounter (INDEPENDENT_AMBULATORY_CARE_PROVIDER_SITE_OTHER): Payer: Self-pay | Admitting: Vascular Surgery

## 2019-05-29 ENCOUNTER — Other Ambulatory Visit: Payer: Self-pay

## 2019-05-29 ENCOUNTER — Ambulatory Visit
Admission: RE | Admit: 2019-05-29 | Discharge: 2019-05-29 | Disposition: A | Discharge status: Home / Self Care | Payer: BLUE CROSS/BLUE SHIELD | Source: Ambulatory Visit | Attending: Obstetrics and Gynecology | Admitting: Obstetrics and Gynecology

## 2019-05-29 DIAGNOSIS — Z1231 Encounter for screening mammogram for malignant neoplasm of breast: Secondary | ICD-10-CM | POA: Diagnosis not present

## 2019-07-29 ENCOUNTER — Encounter (INDEPENDENT_AMBULATORY_CARE_PROVIDER_SITE_OTHER): Payer: BLUE CROSS/BLUE SHIELD

## 2019-07-29 ENCOUNTER — Ambulatory Visit (INDEPENDENT_AMBULATORY_CARE_PROVIDER_SITE_OTHER): Payer: BLUE CROSS/BLUE SHIELD | Admitting: Nurse Practitioner

## 2019-08-17 ENCOUNTER — Other Ambulatory Visit (INDEPENDENT_AMBULATORY_CARE_PROVIDER_SITE_OTHER): Payer: Self-pay | Admitting: Vascular Surgery

## 2020-04-07 ENCOUNTER — Encounter: Payer: BLUE CROSS/BLUE SHIELD | Admitting: Obstetrics and Gynecology

## 2020-04-15 ENCOUNTER — Encounter: Payer: PRIVATE HEALTH INSURANCE | Admitting: Obstetrics and Gynecology

## 2020-04-19 ENCOUNTER — Other Ambulatory Visit: Payer: Self-pay | Admitting: Obstetrics and Gynecology

## 2020-04-19 DIAGNOSIS — N951 Menopausal and female climacteric states: Secondary | ICD-10-CM

## 2020-08-11 ENCOUNTER — Other Ambulatory Visit: Payer: Self-pay | Admitting: Obstetrics and Gynecology

## 2020-08-11 DIAGNOSIS — N951 Menopausal and female climacteric states: Secondary | ICD-10-CM

## 2020-08-12 NOTE — Telephone Encounter (Signed)
Patient has not been seen since 03/2019. Patient needs appointment for further refills. She has canceled all appointments that has been scheduled.

## 2020-11-01 ENCOUNTER — Telehealth: Payer: Self-pay | Admitting: Obstetrics and Gynecology

## 2020-11-01 NOTE — Telephone Encounter (Signed)
Per dr. Amalia Hailey we are unable to fill medications until seen in office. I have moved patient to sooner appointment.

## 2020-11-01 NOTE — Telephone Encounter (Signed)
Patient has seen Dr.Evans in the past- had to be seen at another practice due to insurance coverage- pt insurance has changed and wants to continue care. Pt is going to run out of hormone medication (2 of them) that was originally rx by Dr. Amalia Hailey, pt has an upcoming apt foe 12-01-20, pt request 30 day supply to get her to apt date. Confirmed pharm as [facility].

## 2020-11-09 ENCOUNTER — Encounter: Payer: PRIVATE HEALTH INSURANCE | Admitting: Obstetrics and Gynecology

## 2020-12-01 ENCOUNTER — Encounter: Payer: PRIVATE HEALTH INSURANCE | Admitting: Obstetrics and Gynecology

## 2020-12-01 ENCOUNTER — Other Ambulatory Visit: Payer: Self-pay

## 2020-12-01 ENCOUNTER — Ambulatory Visit (INDEPENDENT_AMBULATORY_CARE_PROVIDER_SITE_OTHER): Payer: 59 | Admitting: Obstetrics and Gynecology

## 2020-12-01 ENCOUNTER — Encounter: Payer: Self-pay | Admitting: Obstetrics and Gynecology

## 2020-12-01 VITALS — BP 139/85 | HR 75 | Ht 66.0 in | Wt 207.2 lb

## 2020-12-01 DIAGNOSIS — Z01419 Encounter for gynecological examination (general) (routine) without abnormal findings: Secondary | ICD-10-CM

## 2020-12-01 DIAGNOSIS — N951 Menopausal and female climacteric states: Secondary | ICD-10-CM | POA: Diagnosis not present

## 2020-12-01 DIAGNOSIS — Z1231 Encounter for screening mammogram for malignant neoplasm of breast: Secondary | ICD-10-CM | POA: Diagnosis not present

## 2020-12-01 MED ORDER — ESTRADIOL 1 MG PO TABS: 1.5000 mg | ORAL_TABLET | Freq: Every day | ORAL | 4 refills | Status: DC

## 2020-12-01 MED ORDER — MEDROXYPROGESTERONE ACETATE 2.5 MG PO TABS: 2.5000 mg | ORAL_TABLET | Freq: Every day | ORAL | 4 refills | Status: DC

## 2020-12-01 NOTE — Progress Notes (Signed)
HPI:      Pamela Madden is a 55 y.o. [phone] who LMP was No LMP recorded (lmp unknown). Patient is postmenopausal.  Subjective:   She presents today for her annual examination.  She continues to take the estrogen progesterone.  She says it is definitely helping with hot flashes.  She occasionally has a hot flash but it is not as severe and not as frequent as it used to be.  She would like to remain on HRT. She has no other issues at this time. She is fully vaccinated.    Hx: The following portions of the patient's history were reviewed and updated as appropriate:             She  has a past medical history of Hyperlipidemia, Hypertension, PVD (peripheral vascular disease) ([place]), and Raynaud disease. She does not have any pertinent problems on file. She  has a past surgical history that includes Spine surgery; Cervical laminectomy; Cholecystectomy; Mitral valve replacement; Tonsillectomy; and Colonoscopy with propofol (N/A, 08/22/2016). Her family history includes Diabetes in her father, mother, and sister; Stroke in her paternal grandmother. She  reports that she quit smoking about 24 years ago. Her smoking use included cigarettes. She has never used smokeless tobacco. She reports that she does not drink alcohol and does not use drugs. She has a current medication list which includes the following prescription(s): aspirin, bupropion, clopidogrel, coenzyme q10-vitamin e, cyanocobalamin, magnesium oxide, melatonin, multivitamin, pregabalin, rosuvastatin, turmeric, alprazolam, bisoprolol, estradiol, estradiol-norethindrone, furosemide, medroxyprogesterone, meloxicam, metoprolol succinate, fish oil, and pravastatin. She is allergic to ace inhibitors, erythromycin, and iodine.       Review of Systems:  Review of Systems  Constitutional: Denied constitutional symptoms, night sweats, recent illness, fatigue, fever, insomnia and weight loss.  Eyes: Denied eye symptoms, eye pain, photophobia,  vision change and visual disturbance.  Ears/Nose/Throat/Neck: Denied ear, nose, throat or neck symptoms, hearing loss, nasal discharge, sinus congestion and sore throat.  Cardiovascular: Denied cardiovascular symptoms, arrhythmia, chest pain/pressure, edema, exercise intolerance, orthopnea and palpitations.  Respiratory: Denied pulmonary symptoms, asthma, pleuritic pain, productive sputum, cough, dyspnea and wheezing.  Gastrointestinal: Denied, gastro-esophageal reflux, melena, nausea and vomiting.  Genitourinary: Denied genitourinary symptoms including symptomatic vaginal discharge, pelvic relaxation issues, and urinary complaints.  Musculoskeletal: Denied musculoskeletal symptoms, stiffness, swelling, muscle weakness and myalgia.  Dermatologic: Denied dermatology symptoms, rash and scar.  Neurologic: Denied neurology symptoms, dizziness, headache, neck pain and syncope.  Psychiatric: Denied psychiatric symptoms, anxiety and depression.  Endocrine: Denied endocrine symptoms including hot flashes and night sweats.   Meds:   Current Outpatient Medications on File Prior to Visit  Medication Sig Dispense Refill  . aspirin 325 MG tablet Take 325 mg by mouth daily.    Marland Kitchen buPROPion (WELLBUTRIN XL) 150 MG 24 hr tablet Take 150 mg by mouth daily.    . clopidogrel (PLAVIX) 75 MG tablet     . Coenzyme Q10-Vitamin E (COQ10 ST-100 PO) Take by mouth.    . cyanocobalamin (,VITAMIN B-12,) 1000 MCG/ML injection Inject 5,000 mcg into the muscle once.     . magnesium oxide (MAG-OX) 400 MG tablet Take 400 mg by mouth 2 (two) times daily.     . Melatonin 10 MG CAPS Take by mouth.    . Multiple Vitamin (MULTIVITAMIN) tablet Take 1 tablet by mouth daily.    . pregabalin (LYRICA) 50 MG capsule Take by mouth.    . rosuvastatin (CRESTOR) 10 MG tablet Take 10 mg by mouth daily.    Marland Kitchen  Turmeric (QC TUMERIC COMPLEX PO) Take by mouth.    . ALPRAZolam (XANAX) 0.5 MG tablet Take 0.25 mg by mouth at bedtime as needed for  anxiety.  (Patient not taking: Reported on 12/01/2020)    . bisoprolol (ZEBETA) 10 MG tablet Take by mouth. (Patient not taking: Reported on 12/01/2020)    . estradiol-norethindrone (ACTIVELLA) 1-0.5 MG tablet Take 1 tablet by mouth daily. (Patient not taking: Reported on 04/21/2019) 30 tablet 8  . furosemide (LASIX) 40 MG tablet Take 20-40 mg by mouth daily.    . meloxicam (MOBIC) 15 MG tablet Take 7.5 mg by mouth.  (Patient not taking: Reported on 12/01/2020)    . metoprolol succinate (TOPROL-XL) 50 MG 24 hr tablet TAKE ONE AND ONE-HALF TABLET BY MOUTH EVERY DAY    . Omega-3 Fatty Acids (FISH OIL) 1200 MG CPDR Take by mouth. (Patient not taking: Reported on 12/01/2020)    . pravastatin (PRAVACHOL) 40 MG tablet Take 40 mg by mouth daily.  (Patient not taking: Reported on 12/01/2020)     No current facility-administered medications on file prior to visit.          Objective:     Vitals:   12/01/20 1437  BP: 139/85  Pulse: 75    Filed Weights   12/01/20 1437  Weight: 207 lb 3.2 oz (94 kg)              Physical examination General NAD, Conversant  HEENT Atraumatic; Op clear with mmm.  Normo-cephalic. Pupils reactive. Anicteric sclerae  Thyroid/Neck Smooth without nodularity or enlargement. Normal ROM.  Neck Supple.  Skin No rashes, lesions or ulceration. Normal palpated skin turgor. No nodularity.  Breasts: No masses or discharge.  Symmetric.  No axillary adenopathy.  Lungs: Clear to auscultation.No rales or wheezes. Normal Respiratory effort, no retractions.  Heart: NSR.  No murmurs or rubs appreciated. No periferal edema  Abdomen: Soft.  Non-tender.  No masses.  No HSM. No hernia  Extremities: Moves all appropriately.  Normal ROM for age. No lymphadenopathy.  Neuro: Oriented to PPT.  Normal mood. Normal affect.     Pelvic:   Vulva: Normal appearance.  No lesions.  Vagina: No lesions or abnormalities noted.  Support: Normal pelvic support.  Urethra No masses tenderness or  scarring.  Meatus Normal size without lesions or prolapse.  Cervix: Normal appearance.  No lesions.  Anus: Normal exam.  No lesions.  Perineum: Normal exam.  No lesions.        Bimanual   Uterus: Normal size.  Non-tender.  Mobile.  AV.  Adnexae: No masses.  Non-tender to palpation.  Cul-de-sac: Negative for abnormality.      Assessment:    [id number] Patient Active Problem List   Diagnosis Date Noted  . Chronic venous insufficiency 05/03/2019  . Pain in limb 04/21/2019  . Lumbar disc disease 05/01/2018  . Peripheral vascular disease ([place]) 12/31/2016  . Raynaud's disease without gangrene 10/29/2016  . Tubular adenoma 10/29/2016  . Benign essential hypertension 04/25/2016  . Hyperlipidemia, mixed 04/25/2016     1. Well woman exam with routine gynecological exam   2. Encounter for screening mammogram for breast cancer   3. Symptomatic menopausal or female climacteric states        Plan:            1.  Basic Screening Recommendations The basic screening recommendations for asymptomatic women were discussed with the patient during her visit.  The age-appropriate recommendations were discussed with her and the  rational for the tests reviewed.  When I am informed by the patient that another primary care physician has previously obtained the age-appropriate tests and they are up-to-date, only outstanding tests are ordered and referrals given as necessary.  Abnormal results of tests will be discussed with her when all of her results are completed.  Routine preventative health maintenance measures emphasized: Exercise/Diet/Weight control, Tobacco Warnings, Alcohol/Substance use risks and Stress Management Mammogram ordered -lab work through PCP 2.  Patient would like to continue HRT.  We have decided upon maintaining current dosage despite occasional hot flashes.  Risks and benefits again discussed for HRT.   Orders Orders Placed This Encounter  Procedures  . MM 3D SCREEN BREAST  BILATERAL     Meds ordered this encounter  Medications  . estradiol (ESTRACE) 1 MG tablet    Sig: Take 1.5 tablets (1.5 mg total) by mouth daily.    Dispense:  135 tablet    Refill:  4    PT REQUEST REFILLS.SHE CAME TO APPT BUT YOU NO LONGER TAKE HER INS.CAN YOU SEND REFILLS UNTIL SHE FINDS NEW MD?  . medroxyPROGESTERone (PROVERA) 2.5 MG tablet    Sig: Take 1 tablet (2.5 mg total) by mouth daily.    Dispense:  90 tablet    Refill:  4    PT REQUEST REFILLS PLEASE.PT CAME TO APPT BUT YOU NO LONGER TAKE HER INS.CAN YOU SEND REFILLS TIL SHE CAN FIND NEW MD?          F/U  No follow-ups on file.  Finis Bud, M.D. 12/01/2020 2:59 PM

## 2021-01-13 ENCOUNTER — Ambulatory Visit
Admission: RE | Admit: 2021-01-13 | Discharge: 2021-01-13 | Disposition: A | Discharge status: Home / Self Care | Payer: 59 | Source: Ambulatory Visit | Attending: Obstetrics and Gynecology | Admitting: Obstetrics and Gynecology

## 2021-01-13 ENCOUNTER — Other Ambulatory Visit: Payer: Self-pay

## 2021-01-13 DIAGNOSIS — Z1231 Encounter for screening mammogram for malignant neoplasm of breast: Secondary | ICD-10-CM | POA: Diagnosis not present

## 2021-05-16 ENCOUNTER — Encounter: Payer: Self-pay | Admitting: Internal Medicine

## 2021-05-17 ENCOUNTER — Encounter: Payer: Self-pay | Admitting: Internal Medicine

## 2021-05-17 ENCOUNTER — Ambulatory Visit: Payer: 59 | Admitting: Certified Registered Nurse Anesthetist

## 2021-05-17 ENCOUNTER — Ambulatory Visit
Admission: RE | Admit: 2021-05-17 | Discharge: 2021-05-17 | Disposition: A | Discharge status: Home / Self Care | Payer: 59 | Source: Ambulatory Visit | Attending: Internal Medicine | Admitting: Internal Medicine

## 2021-05-17 ENCOUNTER — Other Ambulatory Visit: Payer: Self-pay

## 2021-05-17 ENCOUNTER — Encounter
Admission: RE | Disposition: A | Discharge status: Home / Self Care | Payer: Self-pay | Source: Ambulatory Visit | Attending: Internal Medicine

## 2021-05-17 DIAGNOSIS — D123 Benign neoplasm of transverse colon: Secondary | ICD-10-CM | POA: Diagnosis not present

## 2021-05-17 DIAGNOSIS — Z79899 Other long term (current) drug therapy: Secondary | ICD-10-CM | POA: Diagnosis not present

## 2021-05-17 DIAGNOSIS — E785 Hyperlipidemia, unspecified: Secondary | ICD-10-CM | POA: Diagnosis not present

## 2021-05-17 DIAGNOSIS — Z888 Allergy status to other drugs, medicaments and biological substances status: Secondary | ICD-10-CM | POA: Insufficient documentation

## 2021-05-17 DIAGNOSIS — Z881 Allergy status to other antibiotic agents status: Secondary | ICD-10-CM | POA: Insufficient documentation

## 2021-05-17 DIAGNOSIS — Z1211 Encounter for screening for malignant neoplasm of colon: Secondary | ICD-10-CM | POA: Diagnosis not present

## 2021-05-17 DIAGNOSIS — I7300 Raynaud's syndrome without gangrene: Secondary | ICD-10-CM | POA: Insufficient documentation

## 2021-05-17 DIAGNOSIS — Z7902 Long term (current) use of antithrombotics/antiplatelets: Secondary | ICD-10-CM | POA: Insufficient documentation

## 2021-05-17 DIAGNOSIS — I739 Peripheral vascular disease, unspecified: Secondary | ICD-10-CM | POA: Diagnosis not present

## 2021-05-17 DIAGNOSIS — I6523 Occlusion and stenosis of bilateral carotid arteries: Secondary | ICD-10-CM | POA: Diagnosis not present

## 2021-05-17 DIAGNOSIS — Z7982 Long term (current) use of aspirin: Secondary | ICD-10-CM | POA: Insufficient documentation

## 2021-05-17 DIAGNOSIS — I10 Essential (primary) hypertension: Secondary | ICD-10-CM | POA: Diagnosis not present

## 2021-05-17 DIAGNOSIS — K5730 Diverticulosis of large intestine without perforation or abscess without bleeding: Secondary | ICD-10-CM | POA: Insufficient documentation

## 2021-05-17 HISTORY — DX: Gastro-esophageal reflux disease without esophagitis: K21.9

## 2021-05-17 HISTORY — DX: Depression, unspecified: F32.A

## 2021-05-17 HISTORY — DX: Unspecified osteoarthritis, unspecified site: M19.90

## 2021-05-17 HISTORY — PX: COLONOSCOPY WITH PROPOFOL: SHX5780

## 2021-05-17 HISTORY — DX: Occlusion and stenosis of unspecified carotid artery: I65.29

## 2021-05-17 HISTORY — DX: Anxiety disorder, unspecified: F41.9

## 2021-05-17 SURGERY — COLONOSCOPY WITH PROPOFOL
Anesthesia: General

## 2021-05-17 MED ORDER — LIDOCAINE HCL (CARDIAC) PF 100 MG/5ML IV SOSY
PREFILLED_SYRINGE | INTRAVENOUS | Status: DC | PRN
  Administered 2021-05-17: 50 mg via INTRAVENOUS

## 2021-05-17 MED ORDER — PROPOFOL 500 MG/50ML IV EMUL
INTRAVENOUS | Status: DC | PRN
  Administered 2021-05-17: 150 ug/kg/min via INTRAVENOUS

## 2021-05-17 MED ORDER — PROPOFOL 10 MG/ML IV BOLUS
INTRAVENOUS | Status: DC | PRN
  Administered 2021-05-17: 20 mg via INTRAVENOUS
  Administered 2021-05-17: 50 mg via INTRAVENOUS

## 2021-05-17 MED ORDER — SODIUM CHLORIDE 0.9 % IV SOLN: INTRAVENOUS | Status: DC

## 2021-05-17 NOTE — Op Note (Signed)
[facility] Gastroenterology Patient Name: Pamela Madden Procedure Date: 05/17/2021 11:46 AM MRN: [id number] Account #: [id number] Date of Birth: [birth year] Admit Type: Outpatient Age: 55 Room: [facility] ENDO ROOM 2 Gender: Female Note Status: Finalized Instrument Name: Jasper Riling [id number] Procedure:             Colonoscopy Indications:           Surveillance: Personal history of adenomatous polyps                         on last colonoscopy > 3 years ago Providers:             Lorie Apley K. Alixandria Friedt MD, MD Medicines:             Propofol per Anesthesia Complications:         No immediate complications. Procedure:             Pre-Anesthesia Assessment:                        - The risks and benefits of the procedure and the                         sedation options and risks were discussed with the                         patient. All questions were answered and informed                         consent was obtained.                        - Patient identification and proposed procedure were                         verified prior to the procedure by the nurse. The                         procedure was verified in the procedure room.                        - ASA Grade Assessment: III - A patient with severe                         systemic disease.                        - After reviewing the risks and benefits, the patient                         was deemed in satisfactory condition to undergo the                         procedure.                        After obtaining informed consent, the colonoscope was                         passed under direct vision. Throughout the procedure,  the patient's blood pressure, pulse, and oxygen                         saturations were monitored continuously. The                         Colonoscope was introduced through the anus and                         advanced to the the cecum, identified by appendiceal                          orifice and ileocecal valve. The patient tolerated the                         procedure well. The colonoscopy was somewhat difficult                         due to significant looping. Successful completion of                         the procedure was aided by applying abdominal                         pressure. The patient tolerated the procedure well.                         The quality of the bowel preparation was good. The                         ileocecal valve, appendiceal orifice, and rectum were                         photographed. Findings:      The perianal and digital rectal examinations were normal. Pertinent       negatives include normal sphincter tone and no palpable rectal lesions.      A few small-mouthed diverticula were found in the sigmoid colon.      A 8 mm polyp was found in the splenic flexure. The polyp was flat. The       polyp was removed with a saline injection-lift technique using a hot       snare at 20 watts. Resection and retrieval were complete using a suction       (via the working channel). One hemostatic clip was successfully placed       (MR conditional). Estimated blood loss: none.      The exam was otherwise without abnormality. Impression:            - Diverticulosis in the sigmoid colon.                        - One 8 mm polyp at the splenic flexure, removed using                         injection-lift and a hot snare. Resected and                         retrieved. Clip (MR conditional) was placed.                        -  The examination was otherwise normal. Recommendation:        - Patient has a contact number available for                         emergencies. The signs and symptoms of potential                         delayed complications were discussed with the patient.                         Return to normal activities tomorrow. Written                         discharge instructions were provided to the patient.                         - Resume previous diet.                        - Continue present medications.                        - Resume Plavix (clopidogrel) at prior dose today.                         Refer to managing physician for further adjustment of                         therapy.                        - Repeat colonoscopy is recommended for surveillance.                         The colonoscopy date will be determined after                         pathology results from today's exam become available                         for review.                        - Return to GI office PRN.                        - The findings and recommendations were discussed with                         the patient. Procedure Code(s):     --- Professional ---                        [phone], Colonoscopy, flexible; with removal of                         tumor(s), polyp(s), or other lesion(s) by snare                         technique  [id number], Colonoscopy, flexible; with directed submucosal                         injection(s), any substance Diagnosis Code(s):     --- Professional ---                        K57.30, Diverticulosis of large intestine without                         perforation or abscess without bleeding                        K63.5, Polyp of colon                        Z86.010, Personal history of colonic polyps CPT copyright 2019 American [employer] Association. All rights reserved. The codes documented in this report are preliminary and upon coder review may  be revised to meet current compliance requirements. Efrain Sella MD, MD 05/17/2021 12:17:19 PM This report has been signed electronically. Number of Addenda: 0 Note Initiated On: 05/17/2021 11:46 AM Scope Withdrawal Time: 0 hours 8 minutes 49 seconds  Total Procedure Duration: 0 hours 14 minutes 57 seconds  Estimated Blood Loss:  Estimated blood loss: none.      [facility]

## 2021-05-17 NOTE — Interval H&P Note (Signed)
History and Physical Interval Note:  05/17/2021 11:12 AM  Pamela Madden  has presented today for surgery, with the diagnosis of HX OF POLYPS.  The various methods of treatment have been discussed with the patient and family. After consideration of risks, benefits and other options for treatment, the patient has consented to  Procedure(s): COLONOSCOPY WITH PROPOFOL (N/A) as a surgical intervention.  The patient's history has been reviewed, patient examined, no change in status, stable for surgery.  I have reviewed the patient's chart and labs.  Questions were answered to the patient's satisfaction.     [place], [place]

## 2021-05-17 NOTE — Anesthesia Postprocedure Evaluation (Signed)
Anesthesia Post Note  Patient: Pamela Madden  Procedure(s) Performed: COLONOSCOPY WITH PROPOFOL  Patient location during evaluation: Endoscopy Anesthesia Type: General Level of consciousness: awake and alert Pain management: pain level controlled Vital Signs Assessment: post-procedure vital signs reviewed and stable Respiratory status: spontaneous breathing, nonlabored ventilation, respiratory function stable and patient connected to nasal cannula oxygen Cardiovascular status: blood pressure returned to baseline and stable Postop Assessment: no apparent nausea or vomiting Anesthetic complications: no   No notable events documented.   Last Vitals:  Vitals:   05/17/21 1224 05/17/21 1234  BP: (!) 142/92 131/82  Pulse:    Resp:    Temp:    SpO2: 99%     Last Pain:  Vitals:   05/17/21 1234  TempSrc:   PainSc: 0-No pain                 Precious Haws Shanyia Stines

## 2021-05-17 NOTE — H&P (Signed)
Outpatient short stay form Pre-procedure 05/17/2021 11:11 AM Jakevious Hollister K. Alice Reichert, M.D.  Primary Physician: Emily Filbert, M.D.  Reason for visit:  Personal history of multiple adenomatous colon polyps - Colonoscopy - 08/2016.  History of present illness:                            Patient presents for colonoscopy for a personal hx of colon polyps. The patient denies abdominal pain, abnormal weight loss or rectal bleeding.      Current Facility-Administered Medications:    0.9 %  sodium chloride infusion, , Intravenous, Continuous, Omara Alcon, Benay Pike, MD  Medications Prior to Admission  Medication Sig Dispense Refill Last Dose   ALPRAZolam (XANAX) 0.5 MG tablet Take 0.25 mg by mouth at bedtime as needed for anxiety.   05/16/2021   buPROPion (WELLBUTRIN XL) 150 MG 24 hr tablet Take 150 mg by mouth daily.   05/16/2021   cetirizine (ZYRTEC) 10 MG tablet Take 10 mg by mouth daily.   05/16/2021   Cholecalciferol (VITAMIN D3 PO) Take 5,000 Units by mouth daily.   05/16/2021   cyanocobalamin (,VITAMIN B-12,) 1000 MCG/ML injection Inject 5,000 mcg into the muscle once.    Past Month   estradiol (ESTRACE) 1 MG tablet Take 1.5 tablets (1.5 mg total) by mouth daily. 135 tablet 4 05/16/2021   fluticasone (FLONASE) 50 MCG/ACT nasal spray Place 2 sprays into both nostrils daily.      furosemide (LASIX) 40 MG tablet Take 20-40 mg by mouth daily.   05/16/2021   GLUCOSAMINE-CHONDROITIN PO Take by mouth.   05/16/2021   magnesium oxide (MAG-OX) 400 MG tablet Take 400 mg by mouth 2 (two) times daily.    05/16/2021   medroxyPROGESTERone (PROVERA) 2.5 MG tablet Take 1 tablet (2.5 mg total) by mouth daily. 90 tablet 4 05/16/2021   Melatonin 10 MG CAPS Take by mouth.   05/16/2021   metoprolol succinate (TOPROL-XL) 50 MG 24 hr tablet TAKE ONE AND ONE-HALF TABLET BY MOUTH EVERY DAY   05/16/2021   pregabalin (LYRICA) 50 MG capsule Take by mouth.   05/16/2021   rosuvastatin (CRESTOR) 10 MG tablet Take 10 mg by mouth daily.   05/16/2021    Turmeric (QC TUMERIC COMPLEX PO) Take by mouth.   05/16/2021   venlafaxine XR (EFFEXOR-XR) 37.5 MG 24 hr capsule Take 37.5 mg by mouth daily.   05/16/2021   aspirin 325 MG tablet Take 325 mg by mouth daily.   05/12/2021   bisoprolol (ZEBETA) 10 MG tablet Take by mouth. (Patient not taking: Reported on 12/01/2020)      clopidogrel (PLAVIX) 75 MG tablet    05/12/2021   Coenzyme Q10-Vitamin E (COQ10 ST-100 PO) Take by mouth.   05/12/2021   estradiol-norethindrone (ACTIVELLA) 1-0.5 MG tablet Take 1 tablet by mouth daily. (Patient not taking: Reported on 04/21/2019) 30 tablet 8    meloxicam (MOBIC) 15 MG tablet Take 7.5 mg by mouth.  (Patient not taking: Reported on 12/01/2020)      Multiple Vitamin (MULTIVITAMIN) tablet Take 1 tablet by mouth daily.   05/15/2021   Omega-3 Fatty Acids (FISH OIL) 1200 MG CPDR Take by mouth. (Patient not taking: Reported on 12/01/2020)      pravastatin (PRAVACHOL) 40 MG tablet Take 40 mg by mouth daily.  (Patient not taking: Reported on 12/01/2020)        Allergies  Allergen Reactions   Ace Inhibitors    Erythromycin    Iodine  Past Medical History:  Diagnosis Date   Anxiety    Arthritis    Carotid artery stenosis    bilateral   Depression    GERD (gastroesophageal reflux disease)    Hyperlipidemia    Hypertension    PVD (peripheral vascular disease) ([place])    Raynaud disease     Review of systems:  Otherwise negative.    Physical Exam  Gen: Alert, oriented. Appears stated age.  HEENT: /AT. PERRLA. Lungs: CTA, no wheezes. CV: RR nl S1, S2. Abd: soft, benign, no masses. BS+ Ext: No edema. Pulses 2+    Planned procedures: Proceed with colonoscopy. The patient understands the nature of the planned procedure, indications, risks, alternatives and potential complications including but not limited to bleeding, infection, perforation, damage to internal organs and possible oversedation/side effects from anesthesia. The patient agrees and gives consent to  proceed.  Please refer to procedure notes for findings, recommendations and patient disposition/instructions.     Faye Sanfilippo K. Alice Reichert, M.D. Gastroenterology 05/17/2021  11:11 AM

## 2021-05-17 NOTE — Transfer of Care (Signed)
Immediate Anesthesia Transfer of Care Note  Patient: Pamela Madden  Procedure(s) Performed: COLONOSCOPY WITH PROPOFOL  Patient Location: Endoscopy Unit  Anesthesia Type:General  Level of Consciousness: drowsy  Airway & Oxygen Therapy: Patient Spontanous Breathing  Post-op Assessment: Report given to RN and Post -op Vital signs reviewed and stable  Post vital signs: Reviewed and stable  Last Vitals:  Vitals Value Taken Time  BP 155/84 05/17/21 1214  Temp    Pulse 88 05/17/21 1214  Resp 19 05/17/21 1214  SpO2 94 % 05/17/21 1214  Vitals shown include unvalidated device data.  Last Pain:  Vitals:   05/17/21 1100  TempSrc: Temporal  PainSc: 0-No pain         Complications: No notable events documented.

## 2021-05-17 NOTE — Anesthesia Preprocedure Evaluation (Signed)
Anesthesia Evaluation  Patient identified by MRN, date of birth, ID band Patient awake    Reviewed: Allergy & Precautions, NPO status , Patient's Chart, lab work & pertinent test results  History of Anesthesia Complications Negative for: history of anesthetic complications  Airway Mallampati: III  TM Distance: <3 FB Neck ROM: full    Dental  (+) Chipped   Pulmonary neg shortness of breath, former smoker,    Pulmonary exam normal        Cardiovascular Exercise Tolerance: Good hypertension, (-) angina+ Peripheral Vascular Disease  Normal cardiovascular exam     Neuro/Psych PSYCHIATRIC DISORDERS negative neurological ROS     GI/Hepatic Neg liver ROS, GERD  Medicated and Controlled,  Endo/Other  negative endocrine ROS  Renal/GU negative Renal ROS  negative genitourinary   Musculoskeletal  (+) Arthritis ,   Abdominal   Peds  Hematology negative hematology ROS (+)   Anesthesia Other Findings Past Medical History: No date: Anxiety No date: Arthritis No date: Carotid artery stenosis     Comment:  bilateral No date: Depression No date: GERD (gastroesophageal reflux disease) No date: Hyperlipidemia No date: Hypertension No date: PVD (peripheral vascular disease) ([facility]) No date: Raynaud disease  Past Surgical History: No date: CERVICAL LAMINECTOMY No date: CHOLECYSTECTOMY 08/22/2016: COLONOSCOPY WITH PROPOFOL; N/A     Comment:  Procedure: COLONOSCOPY WITH PROPOFOL;  Surgeon: Manya Silvas, MD;  Location[facility] ENDOSCOPY;  Service:               Endoscopy;  Laterality: N/A; No date: PTCA No date: SPINE SURGERY No date: TONSILLECTOMY  BMI    Body Mass Index: 34.54 kg/m      Reproductive/Obstetrics negative OB ROS                             Anesthesia Physical Anesthesia Plan  ASA: 3  Anesthesia Plan: General   Post-op Pain Management:    Induction:  Intravenous  PONV Risk Score and Plan: Propofol infusion and TIVA  Airway Management Planned: Natural Airway and Nasal Cannula  Additional Equipment:   Intra-op Plan:   Post-operative Plan:   Informed Consent: I have reviewed the patients History and Physical, chart, labs and discussed the procedure including the risks, benefits and alternatives for the proposed anesthesia with the patient or authorized representative who has indicated his/her understanding and acceptance.     Dental Advisory Given  Plan Discussed with: Anesthesiologist, CRNA and Surgeon  Anesthesia Plan Comments: (Patient consented for risks of anesthesia including but not limited to:  - adverse reactions to medications - risk of airway placement if required - damage to eyes, teeth, lips or other oral mucosa - nerve damage due to positioning  - sore throat or hoarseness - Damage to heart, brain, nerves, lungs, other parts of body or loss of life  Patient voiced understanding.)        Anesthesia Quick Evaluation

## 2021-05-18 ENCOUNTER — Encounter: Payer: Self-pay | Admitting: Internal Medicine

## 2021-05-18 LAB — SURGICAL PATHOLOGY

## 2021-09-24 ENCOUNTER — Encounter: Payer: Self-pay | Admitting: Obstetrics and Gynecology

## 2021-12-07 DIAGNOSIS — Z0000 Encounter for general adult medical examination without abnormal findings: Secondary | ICD-10-CM | POA: Diagnosis not present

## 2021-12-08 ENCOUNTER — Encounter: Payer: PRIVATE HEALTH INSURANCE | Admitting: Obstetrics and Gynecology

## 2021-12-21 ENCOUNTER — Encounter: Payer: Self-pay | Admitting: Obstetrics and Gynecology

## 2021-12-22 DIAGNOSIS — R739 Hyperglycemia, unspecified: Secondary | ICD-10-CM | POA: Diagnosis not present

## 2021-12-22 DIAGNOSIS — Z0000 Encounter for general adult medical examination without abnormal findings: Secondary | ICD-10-CM | POA: Diagnosis not present

## 2022-01-02 ENCOUNTER — Ambulatory Visit (INDEPENDENT_AMBULATORY_CARE_PROVIDER_SITE_OTHER): Payer: 59 | Admitting: Obstetrics and Gynecology

## 2022-01-02 ENCOUNTER — Other Ambulatory Visit (HOSPITAL_COMMUNITY)
Admission: RE | Admit: 2022-01-02 | Discharge: 2022-01-02 | Disposition: A | Discharge status: Home / Self Care | Payer: 59 | Source: Ambulatory Visit | Attending: Obstetrics and Gynecology | Admitting: Obstetrics and Gynecology

## 2022-01-02 ENCOUNTER — Encounter: Payer: Self-pay | Admitting: Obstetrics and Gynecology

## 2022-01-02 VITALS — BP 116/78 | HR 76 | Ht 66.0 in | Wt 204.5 lb

## 2022-01-02 DIAGNOSIS — N950 Postmenopausal bleeding: Secondary | ICD-10-CM

## 2022-01-02 DIAGNOSIS — Z1231 Encounter for screening mammogram for malignant neoplasm of breast: Secondary | ICD-10-CM

## 2022-01-02 DIAGNOSIS — Z01419 Encounter for gynecological examination (general) (routine) without abnormal findings: Secondary | ICD-10-CM | POA: Diagnosis not present

## 2022-01-02 DIAGNOSIS — Z124 Encounter for screening for malignant neoplasm of cervix: Secondary | ICD-10-CM

## 2022-01-02 NOTE — Progress Notes (Signed)
Patients presents for annual exam today. She states she has been doing well over the last year, hot flashes are much better now with continued use of medications. Patient is due for pap smear, ordered. Patient is due for mammogram, ordered. Patients annual labs through PCP. Patient states no other questions or concerns at this time.   ?

## 2022-01-02 NOTE — Progress Notes (Signed)
HPI: ?     [name] is a 56 y.o. [id number] who LMP was No LMP recorded (lmp unknown). Patient is postmenopausal. ? ?Subjective:  ? ?She presents today for her annual examination.  She seems well and is happy on HRT.  She reports her hot flashes are minimal.  She does state that she has occasional pain in her left lower abdomen where her thigh joins her abdomen.  She reports it is worse at the end of the day and when she has been active. ?In addition she states that she has had 2 or 3 episodes in the last year of bleeding that is dark blood that lasted for approximately 1 to 2 days.  She reports no other symptoms with it.  She is not missing her HRT on this days. ? ?  Hx: ?The following portions of the patient's history were reviewed and updated as appropriate: ?            She  has a past medical history of Anxiety, Arthritis, Carotid artery stenosis, Depression, GERD (gastroesophageal reflux disease), Hyperlipidemia, Hypertension, PVD (peripheral vascular disease) ([place]), and Raynaud disease. ?She does not have any pertinent problems on file. ?She  has a past surgical history that includes Spine surgery; Cervical laminectomy; Cholecystectomy; Mitral valve replacement; Tonsillectomy; Colonoscopy with propofol (N/A, 08/22/2016); and Colonoscopy with propofol (N/A, 05/17/2021). ?Her family history includes Diabetes in her father, mother, and sister; Stroke in her paternal grandmother. ?She  reports that she quit smoking about 25 years ago. Her smoking use included cigarettes. She has never used smokeless tobacco. She reports that she does not drink alcohol and does not use drugs. ?She has a current medication list which includes the following prescription(s): alprazolam, aspirin, cetirizine, cholecalciferol, clopidogrel, coenzyme q10-vitamin e, cyanocobalamin, estradiol, fluticasone, furosemide, glucosamine-chondroitin, magnesium oxide, medroxyprogesterone, melatonin, metformin, multivitamin, fish oil,  rosuvastatin, and turmeric. ?She is allergic to ace inhibitors, erythromycin, and iodine. ?      ?Review of Systems:  ?Review of Systems ? ?Constitutional: Denied constitutional symptoms, night sweats, recent illness, fatigue, fever, insomnia and weight loss.  ?Eyes: Denied eye symptoms, eye pain, photophobia, vision change and visual disturbance.  ?Ears/Nose/Throat/Neck: Denied ear, nose, throat or neck symptoms, hearing loss, nasal discharge, sinus congestion and sore throat.  ?Cardiovascular: Denied cardiovascular symptoms, arrhythmia, chest pain/pressure, edema, exercise intolerance, orthopnea and palpitations.  ?Respiratory: Denied pulmonary symptoms, asthma, pleuritic pain, productive sputum, cough, dyspnea and wheezing.  ?Gastrointestinal: Denied, gastro-esophageal reflux, melena, nausea and vomiting.  ?Genitourinary: See HPI for additional information.  ?Musculoskeletal: See HPI for additional information.  ?Dermatologic: Denied dermatology symptoms, rash and scar.  ?Neurologic: Denied neurology symptoms, dizziness, headache, neck pain and syncope.  ?Psychiatric: Denied psychiatric symptoms, anxiety and depression.  ?Endocrine: Denied endocrine symptoms including hot flashes and night sweats.  ? ?Meds: ?  ?Current Outpatient Medications on File Prior to Visit  ?Medication Sig Dispense Refill  ? ALPRAZolam (XANAX) 0.5 MG tablet Take 0.25 mg by mouth at bedtime as needed for anxiety.    ? aspirin 325 MG tablet Take 325 mg by mouth daily.    ? cetirizine (ZYRTEC) 10 MG tablet Take 10 mg by mouth daily.    ? Cholecalciferol (VITAMIN D3 PO) Take 5,000 Units by mouth daily.    ? clopidogrel (PLAVIX) 75 MG tablet     ? Coenzyme Q10-Vitamin E (COQ10 ST-100 PO) Take by mouth.    ? cyanocobalamin (,VITAMIN B-12,) 1000 MCG/ML injection Inject 5,000 mcg into the muscle once.     ?  estradiol (ESTRACE) 1 MG tablet Take 1.5 tablets (1.5 mg total) by mouth daily. 135 tablet 4  ? fluticasone (FLONASE) 50 MCG/ACT nasal spray  Place 2 sprays into both nostrils daily.    ? furosemide (LASIX) 40 MG tablet Take 20-40 mg by mouth daily.    ? GLUCOSAMINE-CHONDROITIN PO Take by mouth.    ? magnesium oxide (MAG-OX) 400 MG tablet Take 400 mg by mouth 2 (two) times daily.     ? medroxyPROGESTERone (PROVERA) 2.5 MG tablet Take 1 tablet (2.5 mg total) by mouth daily. 90 tablet 4  ? Melatonin 10 MG CAPS Take by mouth.    ? metFORMIN (GLUCOPHAGE-XR) 500 MG 24 hr tablet Take 500 mg by mouth daily with breakfast.    ? Multiple Vitamin (MULTIVITAMIN) tablet Take 1 tablet by mouth daily.    ? Omega-3 Fatty Acids (FISH OIL) 1200 MG CPDR Take by mouth.    ? rosuvastatin (CRESTOR) 10 MG tablet Take 10 mg by mouth daily.    ? Turmeric (QC TUMERIC COMPLEX PO) Take by mouth.    ? ?No current facility-administered medications on file prior to visit.  ? ? ? ?Objective:  ?  ? ?Vitals:  ? 01/02/22 1417  ?BP: 116/78  ?Pulse: 76  ?  ?Filed Weights  ? 01/02/22 1417  ?Weight: 204 lb 8 oz (92.8 kg)  ? ?  ?         Physical examination ?General NAD, Conversant  ?HEENT Atraumatic; Op clear with mmm.  Normo-cephalic. Pupils reactive. Anicteric sclerae  ?Thyroid/Neck Smooth without nodularity or enlargement. Normal ROM.  Neck Supple.  ?Skin No rashes, lesions or ulceration. Normal palpated skin turgor. No nodularity.  ?Breasts: No masses or discharge.  Symmetric.  No axillary adenopathy.  ?Lungs: Clear to auscultation.No rales or wheezes. Normal Respiratory effort, no retractions.  ?Heart: NSR.  No murmurs or rubs appreciated. No periferal edema  ?Abdomen: Soft.  Non-tender.  No masses.  No HSM. No hernia  ?Extremities: Moves all appropriately.  Normal ROM for age. No lymphadenopathy.  ?Neuro: Oriented to PPT.  Normal mood. Normal affect.  ? ?  Pelvic:   ?Vulva: Normal appearance.  No lesions.  ?Vagina: No lesions or abnormalities noted.  ?Support: Normal pelvic support.  ?Urethra No masses tenderness or scarring.  ?Meatus Normal size without lesions or prolapse.  ?Cervix:  Normal appearance.  No lesions.  ?Anus: Normal exam.  No lesions.  ?Perineum: Normal exam.  No lesions.  ?      Bimanual   ?Uterus: Normal size.  Non-tender.  Mobile.  AV.  ?Adnexae: No masses.  Non-tender to palpation.  ?Cul-de-sac: Negative for abnormality.  ? ? ? ?Assessment:  ?  ?G2P2002 ?Patient Active Problem List  ? Diagnosis Date Noted  ? Chronic venous insufficiency 05/03/2019  ? Pain in limb 04/21/2019  ? Lumbar disc disease 05/01/2018  ? Peripheral vascular disease ([place]) 12/31/2016  ? Raynaud's disease without gangrene 10/29/2016  ? Tubular adenoma 10/29/2016  ? Benign essential hypertension 04/25/2016  ? Hyperlipidemia, mixed 04/25/2016  ? ?  ?1. Well woman exam with routine gynecological exam   ?2. Encounter for screening mammogram for breast cancer   ?3. Cervical cancer screening   ?4. Postmenopausal bleeding   ? ? Patient is on HRT and reports she would like to stay on it. ? Occasional postmenopausal bleeding is somewhat concerning.  We have discussed this in detail.  Including work-up by ultrasound and by endometrial biopsy.  Risk of hyperplasia and malignancy discussed. ?  Abdomen examined and no evidence of hernia could be palpated.  The pain could not be reproduced. ? ? ?Plan:  ?  ?       ? 1.  Basic Screening Recommendations ?The basic screening recommendations for asymptomatic women were discussed with the patient during her visit.  The age-appropriate recommendations were discussed with her and the rational for the tests reviewed.  When I am informed by the patient that another primary care physician has previously obtained the age-appropriate tests and they are up-to-date, only outstanding tests are ordered and referrals given as necessary.  Abnormal results of tests will be discussed with her when all of her results are completed.  Routine preventative health maintenance measures emphasized: Exercise/Diet/Weight control, Tobacco Warnings, Alcohol/Substance use risks and Stress Management ?Pap  performed-mammogram ordered ?2.  Recommend ultrasound or endometrial biopsy.  Patient states that her insurance will not cover it and she would like to wait until it happens again.  She does state that if it

## 2022-01-04 LAB — CYTOLOGY - PAP
Comment: NEGATIVE
Diagnosis: NEGATIVE
High risk HPV: NEGATIVE

## 2022-02-15 ENCOUNTER — Ambulatory Visit
Admission: RE | Admit: 2022-02-15 | Discharge: 2022-02-15 | Disposition: A | Discharge status: Home / Self Care | Payer: 59 | Source: Ambulatory Visit | Attending: Obstetrics and Gynecology | Admitting: Obstetrics and Gynecology

## 2022-02-15 DIAGNOSIS — Z01419 Encounter for gynecological examination (general) (routine) without abnormal findings: Secondary | ICD-10-CM | POA: Insufficient documentation

## 2022-02-15 DIAGNOSIS — Z1231 Encounter for screening mammogram for malignant neoplasm of breast: Secondary | ICD-10-CM | POA: Insufficient documentation

## 2022-02-20 ENCOUNTER — Other Ambulatory Visit: Payer: Self-pay | Admitting: Obstetrics and Gynecology

## 2022-02-20 DIAGNOSIS — N6489 Other specified disorders of breast: Secondary | ICD-10-CM

## 2022-02-20 DIAGNOSIS — R928 Other abnormal and inconclusive findings on diagnostic imaging of breast: Secondary | ICD-10-CM

## 2022-02-26 ENCOUNTER — Other Ambulatory Visit: Payer: Self-pay | Admitting: Obstetrics and Gynecology

## 2022-02-26 DIAGNOSIS — R928 Other abnormal and inconclusive findings on diagnostic imaging of breast: Secondary | ICD-10-CM

## 2022-02-26 DIAGNOSIS — N6489 Other specified disorders of breast: Secondary | ICD-10-CM

## 2022-02-26 DIAGNOSIS — N951 Menopausal and female climacteric states: Secondary | ICD-10-CM

## 2022-02-28 ENCOUNTER — Ambulatory Visit
Admission: RE | Admit: 2022-02-28 | Discharge: 2022-02-28 | Disposition: A | Discharge status: Home / Self Care | Payer: 59 | Source: Ambulatory Visit | Attending: Obstetrics and Gynecology | Admitting: Obstetrics and Gynecology

## 2022-02-28 ENCOUNTER — Ambulatory Visit: Payer: 59 | Attending: Hematology and Oncology | Admitting: *Deleted

## 2022-02-28 VITALS — BP 153/83 | Wt 210.2 lb

## 2022-02-28 DIAGNOSIS — Z1239 Encounter for other screening for malignant neoplasm of breast: Secondary | ICD-10-CM

## 2022-02-28 DIAGNOSIS — N6489 Other specified disorders of breast: Secondary | ICD-10-CM

## 2022-02-28 DIAGNOSIS — R928 Other abnormal and inconclusive findings on diagnostic imaging of breast: Secondary | ICD-10-CM

## 2022-02-28 DIAGNOSIS — N6012 Diffuse cystic mastopathy of left breast: Secondary | ICD-10-CM | POA: Diagnosis not present

## 2022-02-28 NOTE — Progress Notes (Signed)
Pamela Madden is a 56 y.o. female who presents to [facility] clinic today with no complaints. Patient referred to [facility] by [facility] due to patient had a screening mammogram completed 02/16/2022 that additional imaging of bilateral breasts recommended for follow up.   Pap Smear: Pap smear not completed today. Last Pap smear was 01/02/2022 at [facility] clinic and was normal with negative HPV. Per patient has history of an abnormal Pap smear around 8 years ago that a repeat Pap smear was completed for follow up that was normal. Per patient has had at least three normal Pap smears since abnormal. Last Pap smear result is available in Epic.   Physical exam: Breasts Breasts symmetrical. No skin abnormalities bilateral breasts. No nipple retraction bilateral breasts. No nipple discharge bilateral breasts. No lymphadenopathy. No lumps palpated bilateral breasts. No complaints of pain or tenderness on exam.     MM 3D SCREEN BREAST BILATERAL  Result Date: 02/16/2022 CLINICAL DATA:  Screening. EXAM: DIGITAL SCREENING BILATERAL MAMMOGRAM WITH TOMOSYNTHESIS AND CAD TECHNIQUE: Bilateral screening digital craniocaudal and mediolateral oblique mammograms were obtained. Bilateral screening digital breast tomosynthesis was performed. The images were evaluated with computer-aided detection. COMPARISON:  Previous exam(s). ACR Breast Density Category c: The breast tissue is heterogeneously dense, which may obscure small masses. FINDINGS: In the right breast a possible focal asymmetry requires further evaluation. In the left breast a possible asymmetry requires further evaluation. IMPRESSION: Further evaluation is suggested for possible focal asymmetry in the right breast. Further evaluation is suggested for possible asymmetry in the left breast. RECOMMENDATION: Diagnostic mammogram and possibly ultrasound of both breasts. (Code:FI-B-78M) The patient will be contacted regarding the findings, and additional  imaging will be scheduled. BI-RADS CATEGORY  0: Incomplete. Need additional imaging evaluation and/or prior mammograms for comparison. Electronically Signed   By: Ileana Roup M.D.   On: 02/16/2022 11:05   MM 3D SCREEN BREAST BILATERAL  Result Date: 01/16/2021 CLINICAL DATA:  Screening. EXAM: DIGITAL SCREENING BILATERAL MAMMOGRAM WITH TOMOSYNTHESIS AND CAD TECHNIQUE: Bilateral screening digital craniocaudal and mediolateral oblique mammograms were obtained. Bilateral screening digital breast tomosynthesis was performed. The images were evaluated with computer-aided detection. COMPARISON:  Previous exam(s). ACR Breast Density Category c: The breast tissue is heterogeneously dense, which may obscure small masses. FINDINGS: There are no findings suspicious for malignancy. The images were evaluated with computer-aided detection. IMPRESSION: No mammographic evidence of malignancy. A result letter of this screening mammogram will be mailed directly to the patient. RECOMMENDATION: Screening mammogram in one year. (Code:SM-B-01Y) BI-RADS CATEGORY  1: Negative. Electronically Signed   By: Audie Pinto M.D.   On: 01/16/2021 09:25   MM 3D SCREEN BREAST BILATERAL  Result Date: 06/01/2019 CLINICAL DATA:  Screening. EXAM: DIGITAL SCREENING BILATERAL MAMMOGRAM WITH TOMO AND CAD COMPARISON:  Previous exam(s). ACR Breast Density Category b: There are scattered areas of fibroglandular density. FINDINGS: There are no findings suspicious for malignancy. Images were processed with CAD. IMPRESSION: No mammographic evidence of malignancy. A result letter of this screening mammogram will be mailed directly to the patient. RECOMMENDATION: Screening mammogram in one year. (Code:SM-B-01Y) BI-RADS CATEGORY  1: Negative. Electronically Signed   By: Marin Olp M.D.   On: 06/01/2019 07:58   MM 3D SCREEN BREAST BILATERAL  Result Date: 04/18/2018 CLINICAL DATA:  Screening. EXAM: DIGITAL SCREENING BILATERAL MAMMOGRAM WITH TOMO AND  CAD COMPARISON:  Previous exam(s). ACR Breast Density Category b: There are scattered areas of fibroglandular density. FINDINGS: There are no findings suspicious for malignancy.  Images were processed with CAD. IMPRESSION: No mammographic evidence of malignancy. A result letter of this screening mammogram will be mailed directly to the patient. RECOMMENDATION: Screening mammogram in one year. (Code:SM-B-01Y) BI-RADS CATEGORY  1: Negative. Electronically Signed   By: Franki Cabot M.D.   On: 04/18/2018 08:33    Pelvic/Bimanual Pap is not indicated today per BCCCP guidelines.   Smoking History: Patient is a former smoker that quit in 1998.   Patient Navigation: Patient education provided. Access to services provided for patient through [facility] program.   Colorectal Cancer Screening: Per patient has had colonoscopy completed on 05/17/2021.  No complaints today.    Breast and Cervical Cancer Risk Assessment: Patient does not have family history of breast cancer, known genetic mutations, or radiation treatment to the chest before age 25. Patient does not have history of cervical dysplasia, immunocompromised, or DES exposure in-utero.  Risk Assessment     Risk Scores       02/28/2022   Last edited by: Demetrius Revel, LPN   5-year risk: 0.9 %   Lifetime risk: 5.7 %            A: BCCCP exam without pap smear No complaints.  P: Referred patient to the [facility] for a bilateral diagnostic mammogram per recommendation. Appointment scheduled Wednesday, February 28, 2022 at 1020.  Pamela Parish, RN 02/28/2022 9:06 AM

## 2022-02-28 NOTE — Patient Instructions (Signed)
Explained breast self awareness with Elita Boone. Patient did not need a Pap smear today due to last Pap smear and HPV typing was 01/02/2022. Let her know B[facility]CP will cover Pap smears and HPV typing every 5 years unless has a history of abnormal Pap smears. Referred patient to the [facility] for a bilateral diagnostic mammogram per recommendation. Appointment scheduled Wednesday, February 28, 2022 at 1020. Patient aware of appointment and will be there. Elita Boone verbalized understanding.  Shariff Lasky, Arvil Chaco, RN 9:07 AM

## 2022-03-01 ENCOUNTER — Other Ambulatory Visit: Payer: Self-pay | Admitting: Obstetrics and Gynecology

## 2022-03-01 DIAGNOSIS — R928 Other abnormal and inconclusive findings on diagnostic imaging of breast: Secondary | ICD-10-CM

## 2022-03-01 DIAGNOSIS — N6489 Other specified disorders of breast: Secondary | ICD-10-CM

## 2022-03-05 ENCOUNTER — Other Ambulatory Visit: Payer: Self-pay | Admitting: Obstetrics and Gynecology

## 2022-03-05 DIAGNOSIS — N951 Menopausal and female climacteric states: Secondary | ICD-10-CM

## 2022-03-07 ENCOUNTER — Encounter: Payer: Self-pay | Admitting: Obstetrics and Gynecology

## 2022-03-08 ENCOUNTER — Other Ambulatory Visit: Payer: Self-pay

## 2022-03-21 ENCOUNTER — Ambulatory Visit
Admission: RE | Admit: 2022-03-21 | Discharge: 2022-03-21 | Disposition: A | Discharge status: Home / Self Care | Payer: 59 | Source: Ambulatory Visit | Attending: Obstetrics and Gynecology | Admitting: Obstetrics and Gynecology

## 2022-03-21 DIAGNOSIS — R928 Other abnormal and inconclusive findings on diagnostic imaging of breast: Secondary | ICD-10-CM | POA: Insufficient documentation

## 2022-03-21 DIAGNOSIS — N6489 Other specified disorders of breast: Secondary | ICD-10-CM | POA: Insufficient documentation

## 2022-03-21 HISTORY — PX: BREAST BIOPSY: SHX20

## 2022-03-22 LAB — SURGICAL PATHOLOGY

## 2022-07-09 ENCOUNTER — Encounter (INDEPENDENT_AMBULATORY_CARE_PROVIDER_SITE_OTHER): Payer: Self-pay

## 2022-09-17 ENCOUNTER — Encounter: Payer: Self-pay | Admitting: Obstetrics and Gynecology

## 2023-01-01 ENCOUNTER — Other Ambulatory Visit: Payer: Self-pay

## 2023-01-01 DIAGNOSIS — E782 Mixed hyperlipidemia: Secondary | ICD-10-CM

## 2023-01-01 DIAGNOSIS — Z0000 Encounter for general adult medical examination without abnormal findings: Secondary | ICD-10-CM

## 2023-01-01 DIAGNOSIS — I739 Peripheral vascular disease, unspecified: Secondary | ICD-10-CM

## 2023-01-10 ENCOUNTER — Encounter: Payer: 59 | Admitting: Obstetrics and Gynecology

## 2023-01-10 DIAGNOSIS — Z01419 Encounter for gynecological examination (general) (routine) without abnormal findings: Secondary | ICD-10-CM

## 2023-01-10 DIAGNOSIS — Z1231 Encounter for screening mammogram for malignant neoplasm of breast: Secondary | ICD-10-CM

## 2023-02-15 ENCOUNTER — Inpatient Hospital Stay: Admission: RE | Admit: 2023-02-15 | Payer: 59 | Source: Ambulatory Visit

## 2023-02-20 ENCOUNTER — Other Ambulatory Visit: Payer: Self-pay | Admitting: Obstetrics and Gynecology

## 2023-02-20 DIAGNOSIS — N951 Menopausal and female climacteric states: Secondary | ICD-10-CM

## 2023-02-26 ENCOUNTER — Ambulatory Visit (INDEPENDENT_AMBULATORY_CARE_PROVIDER_SITE_OTHER): Payer: 59 | Admitting: Obstetrics and Gynecology

## 2023-02-26 ENCOUNTER — Encounter: Payer: Self-pay | Admitting: Obstetrics and Gynecology

## 2023-02-26 VITALS — BP 130/82 | HR 74 | Ht 66.0 in | Wt 216.0 lb

## 2023-02-26 DIAGNOSIS — Z01419 Encounter for gynecological examination (general) (routine) without abnormal findings: Secondary | ICD-10-CM

## 2023-02-26 DIAGNOSIS — Z1231 Encounter for screening mammogram for malignant neoplasm of breast: Secondary | ICD-10-CM

## 2023-02-26 DIAGNOSIS — N951 Menopausal and female climacteric states: Secondary | ICD-10-CM

## 2023-02-26 MED ORDER — MEDROXYPROGESTERONE ACETATE 2.5 MG PO TABS: 2.5000 mg | ORAL_TABLET | Freq: Every day | ORAL | 3 refills | Status: AC

## 2023-02-26 MED ORDER — ESTRADIOL 1 MG PO TABS: 1.5000 mg | ORAL_TABLET | Freq: Every day | ORAL | 4 refills | Status: AC

## 2023-02-26 NOTE — Progress Notes (Signed)
Patients presents for annual exam today. She states daily use of HRT going well, would like to continue. Due for mammogram, ordered. Up to date on pap smear. Annual labs are recently done by PCP. Patient states no other questions or concerns at this time.

## 2023-02-26 NOTE — Progress Notes (Signed)
HPI:      [name] is a 57 y.o. [phone] who LMP was No LMP recorded (lmp unknown). Patient is postmenopausal.  Subjective:   She presents today for her annual examination.  She states that she occasionally has a rash under her breast that she is treating with miconazole.  She says it is mostly gone.  She gets it worse in the summer. She is using her HRT and is not having any issues with it.  Denies bleeding.  Declines pelvic exam today.    Hx: The following portions of the patient's history were reviewed and updated as appropriate:             She  has a past medical history of Anxiety, Arthritis, Carotid artery stenosis, Depression, GERD (gastroesophageal reflux disease), Hyperlipidemia, Hypertension, PVD (peripheral vascular disease) ([facility]), and Raynaud disease. She does not have any pertinent problems on file. She  has a past surgical history that includes Spine surgery; Cervical laminectomy; Cholecystectomy; Mitral valve replacement; Tonsillectomy; Colonoscopy with propofol (N/A, 08/22/2016); Colonoscopy with propofol (N/A, 05/17/2021); and Breast biopsy (Right, 03/21/2022). Her family history includes Diabetes in her father, mother, and sister; Stroke in her paternal grandmother. She  reports that she quit smoking about 26 years ago. Her smoking use included cigarettes. She has never used smokeless tobacco. She reports that she does not drink alcohol and does not use drugs. She has a current medication list which includes the following prescription(s): alprazolam, aspirin, cetirizine, cholecalciferol, clopidogrel, coenzyme q10-vitamin e, cyanocobalamin, fluticasone, furosemide, glucosamine-chondroitin, magnesium oxide, melatonin, metformin, multivitamin, fish oil, rosuvastatin, turmeric, estradiol, and medroxyprogesterone. She is allergic to ace inhibitors, erythromycin, and iodine.       Review of Systems:  Review of Systems  Constitutional: Denied constitutional symptoms, night  sweats, recent illness, fatigue, fever, insomnia and weight loss.  Eyes: Denied eye symptoms, eye pain, photophobia, vision change and visual disturbance.  Ears/Nose/Throat/Neck: Denied ear, nose, throat or neck symptoms, hearing loss, nasal discharge, sinus congestion and sore throat.  Cardiovascular: Denied cardiovascular symptoms, arrhythmia, chest pain/pressure, edema, exercise intolerance, orthopnea and palpitations.  Respiratory: Denied pulmonary symptoms, asthma, pleuritic pain, productive sputum, cough, dyspnea and wheezing.  Gastrointestinal: Denied, gastro-esophageal reflux, melena, nausea and vomiting.  Genitourinary: Denied genitourinary symptoms including symptomatic vaginal discharge, pelvic relaxation issues, and urinary complaints.  Musculoskeletal: Denied musculoskeletal symptoms, stiffness, swelling, muscle weakness and myalgia.  Dermatologic: Denied dermatology symptoms, rash and scar.  Neurologic: Denied neurology symptoms, dizziness, headache, neck pain and syncope.  Psychiatric: Denied psychiatric symptoms, anxiety and depression.  Endocrine: Denied endocrine symptoms including hot flashes and night sweats.   Meds:   Current Outpatient Medications on File Prior to Visit  Medication Sig Dispense Refill   ALPRAZolam (XANAX) 0.5 MG tablet Take 0.25 mg by mouth at bedtime as needed for anxiety.     aspirin 325 MG tablet Take 325 mg by mouth daily.     cetirizine (ZYRTEC) 10 MG tablet Take 10 mg by mouth daily.     Cholecalciferol (VITAMIN D3 PO) Take 5,000 Units by mouth daily.     clopidogrel (PLAVIX) 75 MG tablet      Coenzyme Q10-Vitamin E (COQ10 ST-100 PO) Take by mouth.     cyanocobalamin (,VITAMIN B-12,) 1000 MCG/ML injection Inject 5,000 mcg into the muscle once.      fluticasone (FLONASE) 50 MCG/ACT nasal spray Place 2 sprays into both nostrils daily.     furosemide (LASIX) 40 MG tablet Take 20-40 mg by mouth daily.  GLUCOSAMINE-CHONDROITIN PO Take by mouth.      magnesium oxide (MAG-OX) 400 MG tablet Take 400 mg by mouth 2 (two) times daily.      Melatonin 10 MG CAPS Take by mouth.     metFORMIN (GLUCOPHAGE-XR) 500 MG 24 hr tablet Take 500 mg by mouth daily with breakfast.     Multiple Vitamin (MULTIVITAMIN) tablet Take 1 tablet by mouth daily.     Omega-3 Fatty Acids (FISH OIL) 1200 MG CPDR Take by mouth.     rosuvastatin (CRESTOR) 10 MG tablet Take 10 mg by mouth daily.     Turmeric (QC TUMERIC COMPLEX PO) Take by mouth.     No current facility-administered medications on file prior to visit.     Objective:     Vitals:   02/26/23 0815  BP: 130/82  Pulse: 74    Filed Weights   02/26/23 0815  Weight: 216 lb (98 kg)              Physical examination General NAD, Conversant  HEENT Atraumatic; Op clear with mmm.  Normo-cephalic. Pupils reactive. Anicteric sclerae  Thyroid/Neck Smooth without nodularity or enlargement. Normal ROM.  Neck Supple.  Skin No rashes, lesions or ulceration. Normal palpated skin turgor. No nodularity.  Breasts: No masses or discharge.  Symmetric.  No axillary adenopathy.  Small rash especially under left breast  Lungs: Clear to auscultation.No rales or wheezes. Normal Respiratory effort, no retractions.  Heart: NSR.  No murmurs or rubs appreciated. No peripheral edema  Abdomen: Soft.  Non-tender.  No masses.  No HSM. No hernia  Extremities: Moves all appropriately.  Normal ROM for age. No lymphadenopathy.  Neuro: Oriented to PPT.  Normal mood. Normal affect.      Assessment:    G2P2002 Patient Active Problem List   Diagnosis Date Noted   Chronic venous insufficiency 05/03/2019   Pain in limb 04/21/2019   Lumbar disc disease 05/01/2018   Peripheral vascular disease ([facility]) 12/31/2016   Raynaud's disease without gangrene 10/29/2016   Tubular adenoma 10/29/2016   Benign essential hypertension 04/25/2016   Hyperlipidemia, mixed 04/25/2016     1. Well woman exam with routine gynecological exam   2.  Screening mammogram for breast cancer   3. Symptomatic menopausal or female climacteric states     Patient doing well.   Plan:            1.  Basic Screening Recommendations The basic screening recommendations for asymptomatic women were discussed with the patient during her visit.  The age-appropriate recommendations were discussed with her and the rational for the tests reviewed.  When I am informed by the patient that another primary care physician has previously obtained the age-appropriate tests and they are up-to-date, only outstanding tests are ordered and referrals given as necessary.  Abnormal results of tests will be discussed with her when all of her results are completed.  Routine preventative health maintenance measures emphasized: Exercise/Diet/Weight control, Tobacco Warnings, Alcohol/Substance use risks and Stress Management Mammogram ordered 2.  Continue HRT 3.  Glycemic control and monilia discussed.  Use of antifungal cream or powder under the breasts discussed.  Orders Orders Placed This Encounter  Procedures   MM DIGITAL SCREENING BILATERAL     Meds ordered this encounter  Medications   estradiol (ESTRACE) 1 MG tablet    Sig: Take 1.5 tablets (1.5 mg total) by mouth daily.    Dispense:  135 tablet    Refill:  4    PT  REQUEST REFILLS PLEASE   medroxyPROGESTERone (PROVERA) 2.5 MG tablet    Sig: Take 1 tablet (2.5 mg total) by mouth daily.    Dispense:  90 tablet    Refill:  3    FOR FUTURE REFILLS PLEASE           F/U  Return in about 1 year (around 02/26/2024) for Annual Physical.  Elonda Husky, M.D. 02/26/2023 8:38 AM

## 2023-04-30 ENCOUNTER — Other Ambulatory Visit: Payer: Self-pay | Admitting: Obstetrics and Gynecology

## 2023-04-30 DIAGNOSIS — Z1231 Encounter for screening mammogram for malignant neoplasm of breast: Secondary | ICD-10-CM

## 2023-05-15 ENCOUNTER — Other Ambulatory Visit (HOSPITAL_COMMUNITY)
Admission: RE | Admit: 2023-05-15 | Discharge: 2023-05-15 | Disposition: A | Discharge status: Home / Self Care | Payer: 59 | Source: Ambulatory Visit | Attending: Obstetrics and Gynecology | Admitting: Obstetrics and Gynecology

## 2023-05-15 ENCOUNTER — Ambulatory Visit: Payer: 59 | Admitting: Obstetrics and Gynecology

## 2023-05-15 ENCOUNTER — Encounter: Payer: Self-pay | Admitting: Obstetrics and Gynecology

## 2023-05-15 VITALS — BP 136/84 | HR 83 | Ht 66.0 in | Wt 209.2 lb

## 2023-05-15 DIAGNOSIS — N858 Other specified noninflammatory disorders of uterus: Secondary | ICD-10-CM | POA: Diagnosis not present

## 2023-05-15 DIAGNOSIS — N950 Postmenopausal bleeding: Secondary | ICD-10-CM | POA: Diagnosis present

## 2023-05-15 MED ORDER — NORETHINDRONE ACETATE 5 MG PO TABS: 5.0000 mg | ORAL_TABLET | Freq: Two times a day (BID) | ORAL | 0 refills | Status: AC

## 2023-05-15 NOTE — Progress Notes (Signed)
HPI:      [name] is a 57 y.o. [phone] who LMP was No LMP recorded (lmp unknown). Patient is postmenopausal.  Subjective:   She presents today because she has now had more than a week of postmenopausal bleeding.  She says some days it is light and some days is heavier but reports that it is bright red.  She states that she is not sexually active at this time.  She continues to take HRT as directed and is not missing pills.    Hx: The following portions of the patient's history were reviewed and updated as appropriate:             She  has a past medical history of Anxiety, Arthritis, Carotid artery stenosis, Depression, GERD (gastroesophageal reflux disease), Hyperlipidemia, Hypertension, PVD (peripheral vascular disease) (HCC), and Raynaud disease. She does not have any pertinent problems on file. She  has a past surgical history that includes Spine surgery; Cervical laminectomy; Cholecystectomy; Mitral valve replacement; Tonsillectomy; Colonoscopy with propofol (N/A, 08/22/2016); Colonoscopy with propofol (N/A, 05/17/2021); and Breast biopsy (Right, 03/21/2022). Her family history includes Diabetes in her father, mother, and sister; Stroke in her paternal grandmother. She  reports that she quit smoking about 26 years ago. Her smoking use included cigarettes. She has never used smokeless tobacco. She reports that she does not drink alcohol and does not use drugs. She has a current medication list which includes the following prescription(s): alprazolam, aspirin, cetirizine, cholecalciferol, clopidogrel, coenzyme q10-vitamin e, cyanocobalamin, estradiol, fluticasone, furosemide, glucosamine-chondroitin, magnesium oxide, medroxyprogesterone, melatonin, metformin, multivitamin, norethindrone, fish oil, rosuvastatin, and turmeric. She is allergic to ace inhibitors, erythromycin, and iodine.       Review of Systems:  Review of Systems  Constitutional: Denied constitutional symptoms,  night sweats, recent illness, fatigue, fever, insomnia and weight loss.  Eyes: Denied eye symptoms, eye pain, photophobia, vision change and visual disturbance.  Ears/Nose/Throat/Neck: Denied ear, nose, throat or neck symptoms, hearing loss, nasal discharge, sinus congestion and sore throat.  Cardiovascular: Denied cardiovascular symptoms, arrhythmia, chest pain/pressure, edema, exercise intolerance, orthopnea and palpitations.  Respiratory: Denied pulmonary symptoms, asthma, pleuritic pain, productive sputum, cough, dyspnea and wheezing.  Gastrointestinal: Denied, gastro-esophageal reflux, melena, nausea and vomiting.  Genitourinary: See HPI for additional information.  Musculoskeletal: Denied musculoskeletal symptoms, stiffness, swelling, muscle weakness and myalgia.  Dermatologic: Denied dermatology symptoms, rash and scar.  Neurologic: Denied neurology symptoms, dizziness, headache, neck pain and syncope.  Psychiatric: Denied psychiatric symptoms, anxiety and depression.  Endocrine: Denied endocrine symptoms including hot flashes and night sweats.   Meds:   Current Outpatient Medications on File Prior to Visit  Medication Sig Dispense Refill   ALPRAZolam (XANAX) 0.5 MG tablet Take 0.25 mg by mouth at bedtime as needed for anxiety.     aspirin 325 MG tablet Take 325 mg by mouth daily.     cetirizine (ZYRTEC) 10 MG tablet Take 10 mg by mouth daily.     Cholecalciferol (VITAMIN D3 PO) Take 5,000 Units by mouth daily.     clopidogrel (PLAVIX) 75 MG tablet      Coenzyme Q10-Vitamin E (COQ10 ST-100 PO) Take by mouth.     cyanocobalamin (,VITAMIN B-12,) 1000 MCG/ML injection Inject 5,000 mcg into the muscle once.      estradiol (ESTRACE) 1 MG tablet Take 1.5 tablets (1.5 mg total) by mouth daily. 135 tablet 4   fluticasone (FLONASE) 50 MCG/ACT nasal spray Place 2 sprays into both nostrils daily.     furosemide (LASIX)  40 MG tablet Take 20-40 mg by mouth daily.     GLUCOSAMINE-CHONDROITIN PO  Take by mouth.     magnesium oxide (MAG-OX) 400 MG tablet Take 400 mg by mouth 2 (two) times daily.      medroxyPROGESTERone (PROVERA) 2.5 MG tablet Take 1 tablet (2.5 mg total) by mouth daily. 90 tablet 3   Melatonin 10 MG CAPS Take by mouth.     metFORMIN (GLUCOPHAGE-XR) 500 MG 24 hr tablet Take 500 mg by mouth daily with breakfast.     Multiple Vitamin (MULTIVITAMIN) tablet Take 1 tablet by mouth daily.     Omega-3 Fatty Acids (FISH OIL) 1200 MG CPDR Take by mouth.     rosuvastatin (CRESTOR) 10 MG tablet Take 10 mg by mouth daily.     Turmeric (QC TUMERIC COMPLEX PO) Take by mouth.     No current facility-administered medications on file prior to visit.      Objective:     Vitals:   05/15/23 1429  BP: 136/84  Pulse: 83   Filed Weights   05/15/23 1429  Weight: 209 lb 3.2 oz (94.9 kg)              Physical examination   Pelvic:   Vulva: Normal appearance.  No lesions.  Vagina: No lesions or abnormalities noted.  Support: Normal pelvic support.  Urethra No masses tenderness or scarring.  Meatus Normal size without lesions or prolapse.  Cervix: Normal appearance.  No lesions.  Cervical stenosis  Anus: Normal exam.  No lesions.  Perineum: Normal exam.  No lesions.           Endometrial Biopsy After discussion with the patient regarding her abnormal uterine bleeding I recommended that she proceed with an endometrial biopsy for further diagnosis. The risks, benefits, alternatives, and indications for an endometrial biopsy were discussed with the patient in detail. She understood the risks including infection, bleeding, cervical laceration and uterine perforation.  Verbal consent was obtained.   PROCEDURE NOTE:  Vacurette endometrial biopsy was performed using aseptic technique with iodine preparation.  Significant cervical stenosis was encountered and the cervix had to be slightly dilated before the Vacurette could be passed. The uterus was sounded to a length of 7 cm.   Adequate sampling was obtained with minimal blood loss.  The patient tolerated the procedure well.  Disposition will be pending pathology   Assessment:    G2P2002 Patient Active Problem List   Diagnosis Date Noted   Chronic venous insufficiency 05/03/2019   Pain in limb 04/21/2019   Lumbar disc disease 05/01/2018   Peripheral vascular disease (HCC) 12/31/2016   Raynaud's disease without gangrene 10/29/2016   Tubular adenoma 10/29/2016   Benign essential hypertension 04/25/2016   Hyperlipidemia, mixed 04/25/2016     1. PMB (postmenopausal bleeding)        Plan:            1.  Endometrial biopsy performed.  Will await results  2.  Aygestin to help stop bleeding. Orders No orders of the defined types were placed in this encounter.    Meds ordered this encounter  Medications   norethindrone (AYGESTIN) 5 MG tablet    Sig: Take 1 tablet (5 mg total) by mouth 2 (two) times daily for 21 days. As directed    Dispense:  42 tablet    Refill:  0      F/U  Return for We will contact her with any abnormal test results.  Elonda Husky,  M.D. 05/15/2023 3:35 PM

## 2023-05-15 NOTE — Addendum Note (Signed)
Addended by: Loman Chroman on: 05/15/2023 03:46 PM   Modules accepted: Orders

## 2023-05-15 NOTE — Progress Notes (Signed)
Patient presents today due to PMB. She states the bleeding started 3 weeks ago and varies from light to heavy. Reports no new changes to medication.

## 2023-05-16 ENCOUNTER — Ambulatory Visit
Admission: RE | Admit: 2023-05-16 | Discharge: 2023-05-16 | Disposition: A | Discharge status: Home / Self Care | Payer: 59 | Source: Ambulatory Visit | Attending: Obstetrics and Gynecology | Admitting: Obstetrics and Gynecology

## 2023-05-16 ENCOUNTER — Encounter: Payer: Self-pay | Admitting: Obstetrics and Gynecology

## 2023-05-16 DIAGNOSIS — Z1231 Encounter for screening mammogram for malignant neoplasm of breast: Secondary | ICD-10-CM | POA: Insufficient documentation

## 2023-05-17 ENCOUNTER — Other Ambulatory Visit: Payer: Self-pay

## 2023-05-17 LAB — SURGICAL PATHOLOGY

## 2023-05-18 ENCOUNTER — Encounter: Payer: Self-pay | Admitting: Obstetrics and Gynecology

## 2023-10-26 ENCOUNTER — Other Ambulatory Visit: Payer: Self-pay | Admitting: Medical Genetics

## 2023-10-28 ENCOUNTER — Other Ambulatory Visit: Payer: Self-pay | Admitting: Medical Genetics

## 2023-12-17 IMAGING — MG DIGITAL DIAGNOSTIC BILAT W/ TOMO W/ CAD
8 of 14 series · 8 of 40 positions shown · non-contrast
Comparison: Previous exam(s).

CLINICAL DATA: Bilateral callbacks



[R CC synth-2D (1 of 2)]
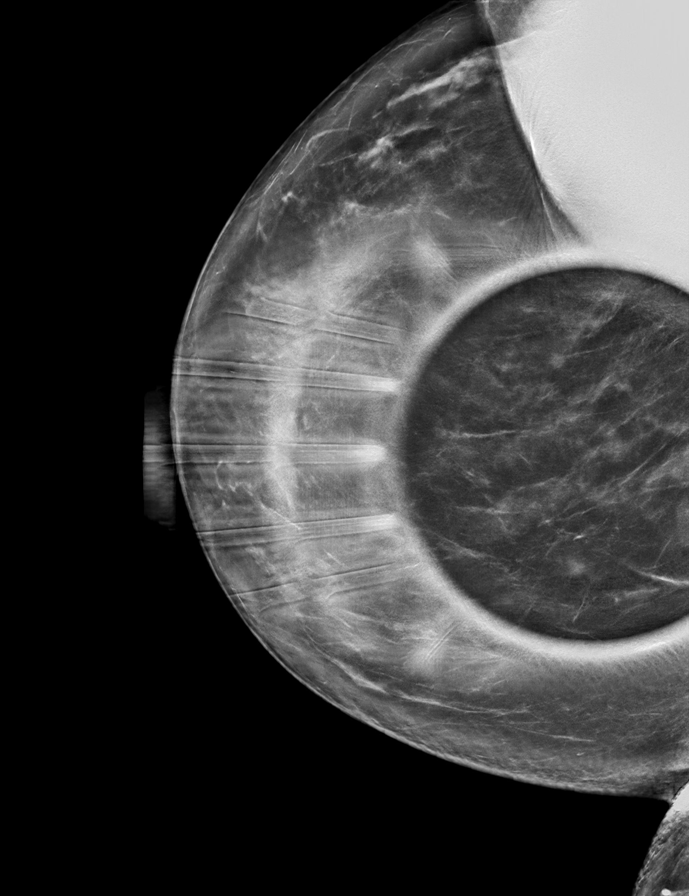

[R LM synth-2D]
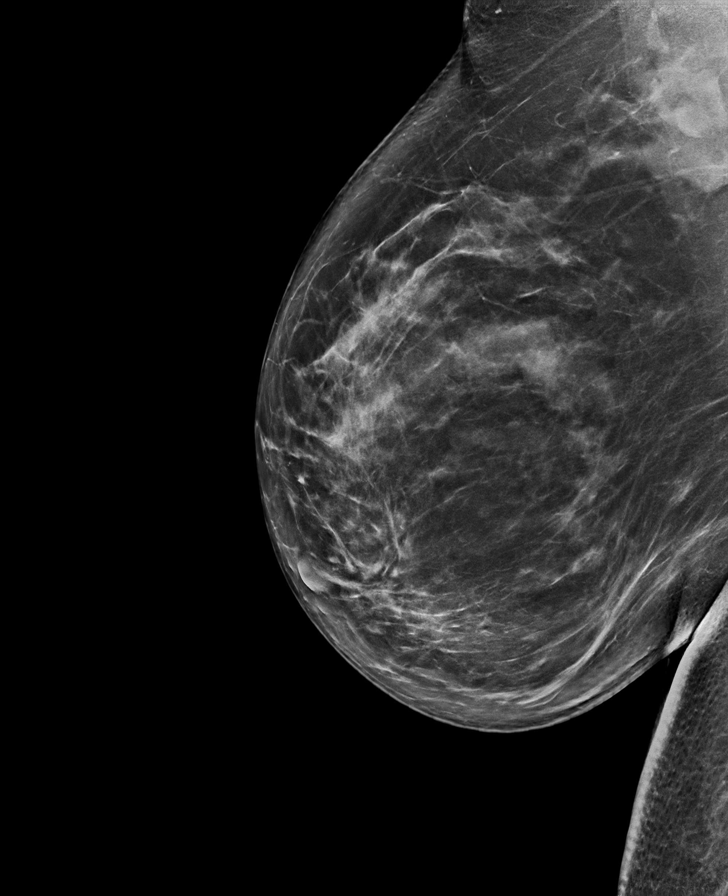

[L CC synth-2D (1 of 2)]
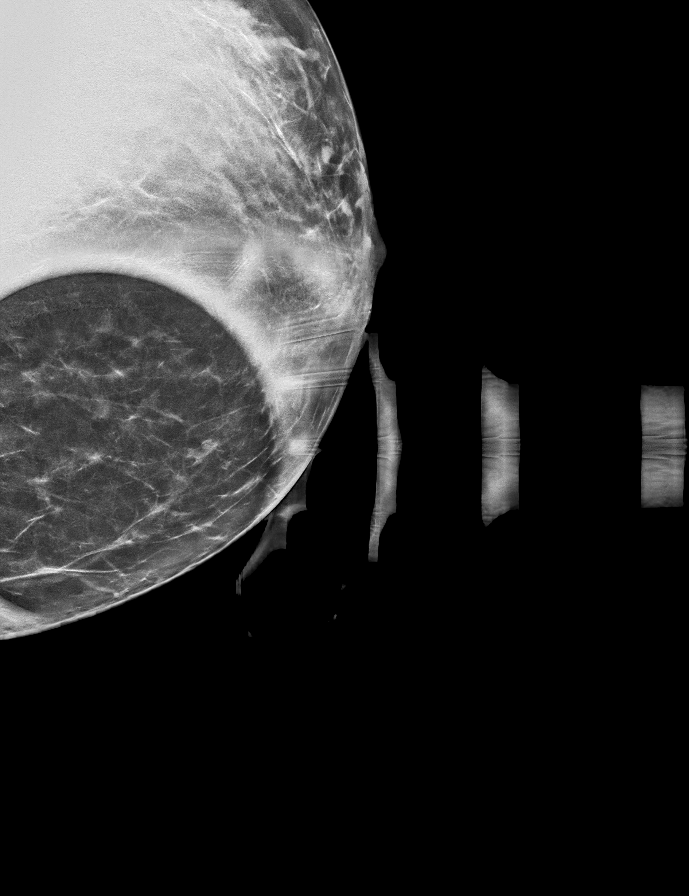

[L MLO synth-2D]
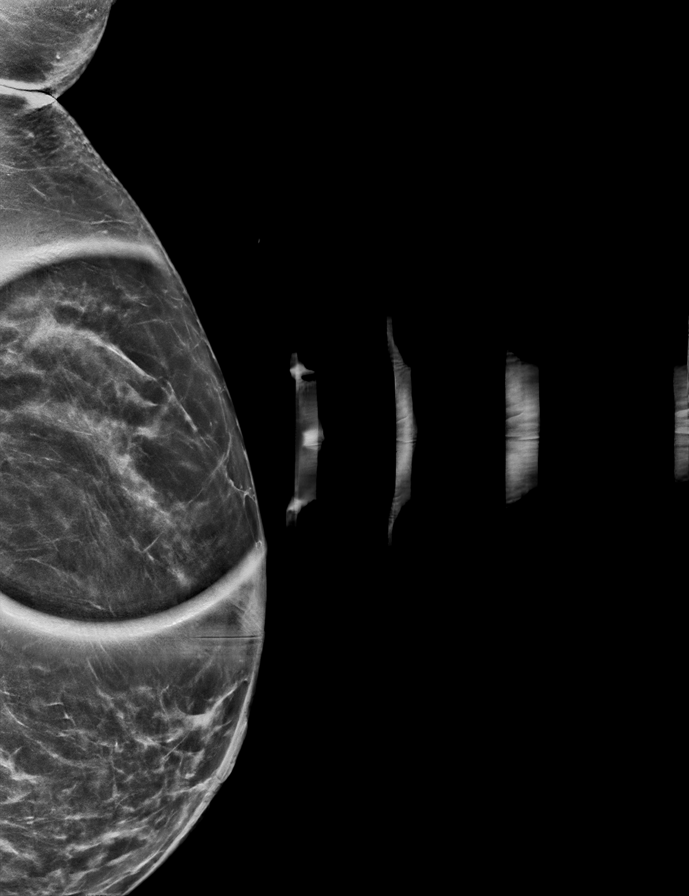

[R MLO synth-2D]
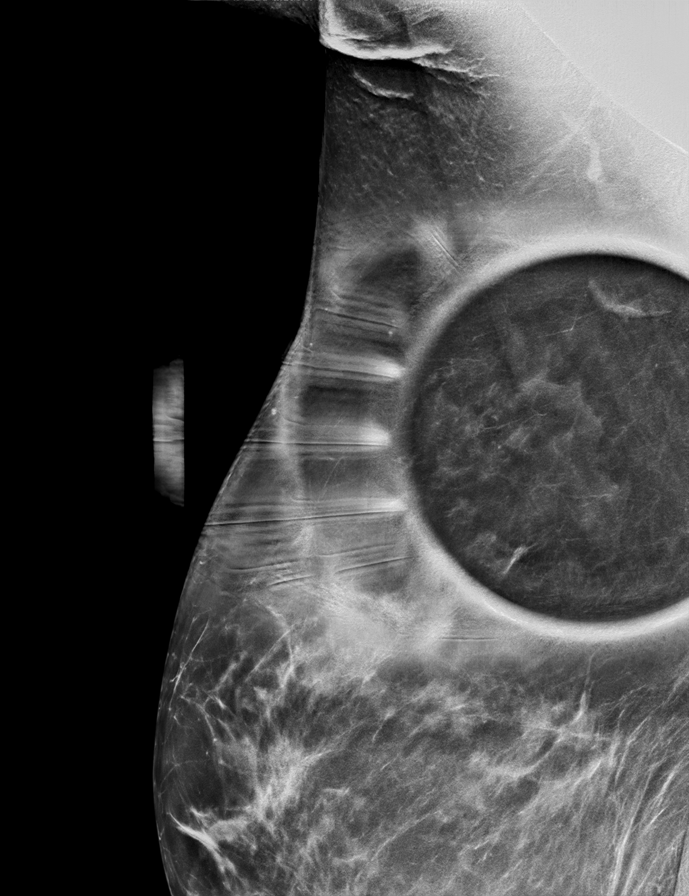

[R CC synth-2D (2 of 2)]
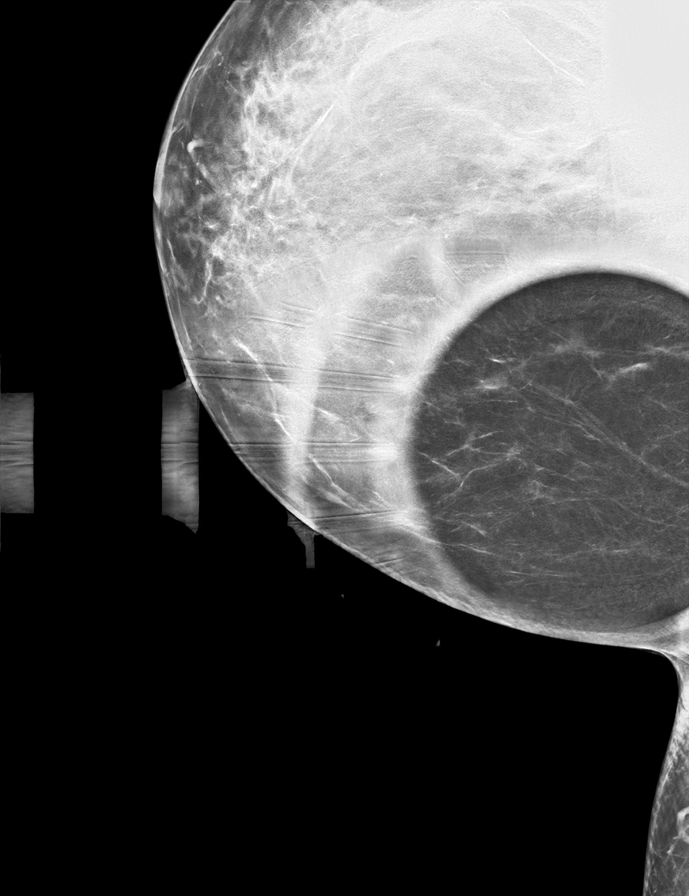

[L CC synth-2D (2 of 2)]
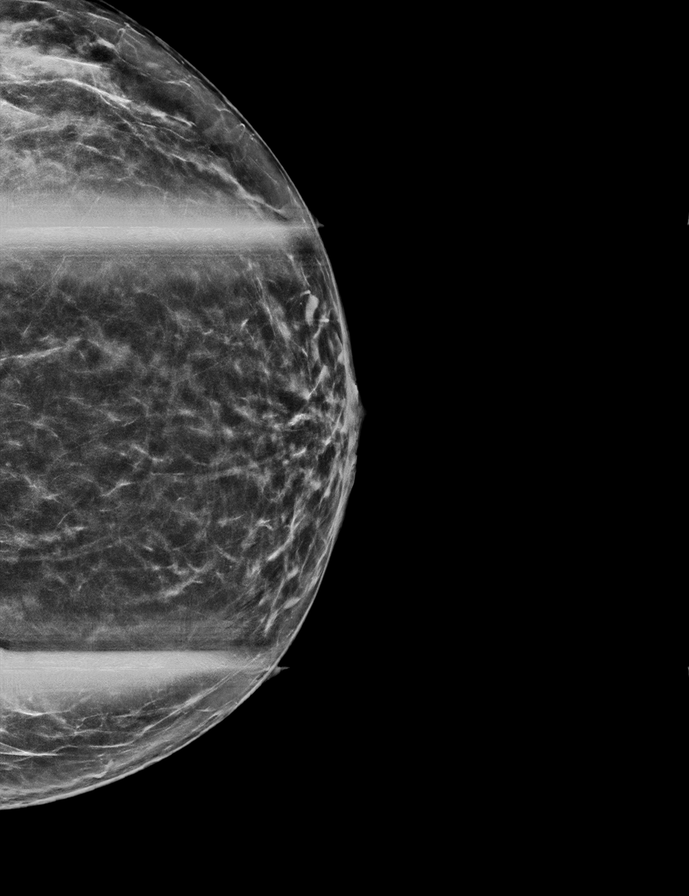

[R CC tomo · tomo slice 47/94.0]
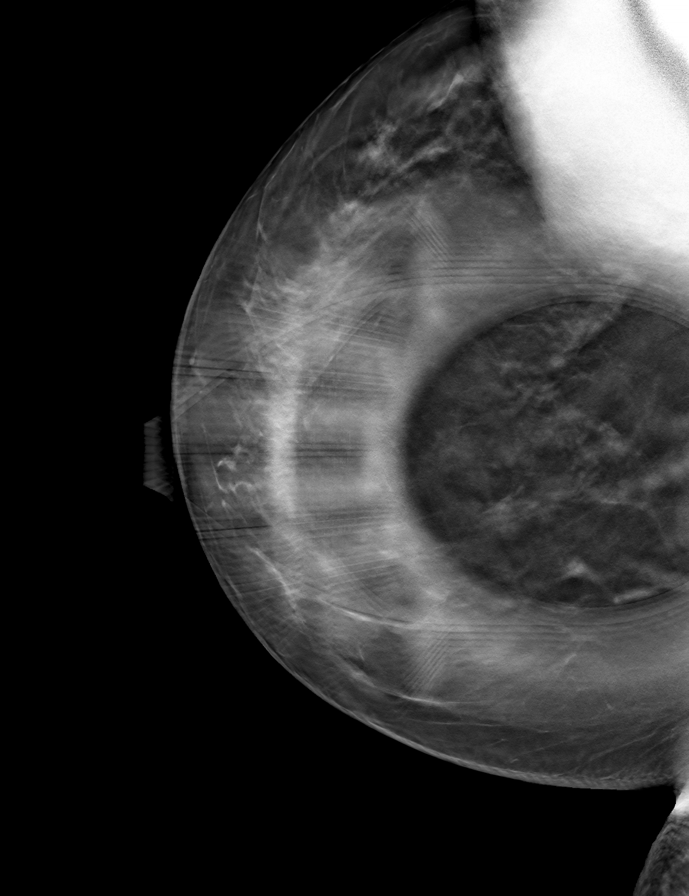

[8 of 40 positions shown; findings below may reference images not displayed]

ACR Breast Density Category c: The breast tissue is heterogeneously
dense, which may obscure small masses.
FINDINGS: Spot compression tomosynthesis views confirm a developing asymmetry
in the RIGHT upper inner breast at posterior depth. There is
interdigitating fat. It is best seen spot MLO slice 27 and ML slice
27.

Previously described finding in the LEFT breast predominantly
effaces on spot MLO imaging. There is a persistent oval
circumscribed mass noted on spot CC volume 2 slice 19.

On physical exam, no suspicious mass is appreciated.

Targeted ultrasound was performed of the RIGHT upper inner breast.
No suspicious cystic or solid mass is seen. There is a dense island
of fibroglandular tissue noted at [DATE] 14 cm from the nipple which
is favored to reflect the sonographic correlate for mammographically
identified developing asymmetry.

Targeted ultrasound was performed of the LEFT upper breast. At 12
o'clock 8 cm from the nipple, there is an oval circumscribed
anechoic mass with posterior acoustic enhancement with thin internal
septations. It measures 8 by 6 x 3 mm and is consistent with a
benign minimally complicated cyst. This corresponds to the site of
screening mammographic concern. Multiple additional tiny benign
cysts were noted during real-time examination.
IMPRESSION: 1. There is a developing asymmetry in the RIGHT upper inner breast
at far posterior depth. Favored sonographic correlate is located in
the upper chest at [DATE] cm from the nipple. This may reflect a
benign process such as pseudoangiomatous stromal hyperplasia but
recommend ultrasound-guided biopsy for definitive characterization.
Recommend attention on post marker placement mammogram to assess for
sonographic/mammographic correlation.
2. There are benign cysts in the LEFT breast. No mammographic or
sonographic evidence of malignancy at the site of screening
mammographic concern.

RECOMMENDATION:
RIGHT breast ultrasound-guided biopsy x1

I have discussed the findings and recommendations with the patient.
The biopsy procedure was discussed with the patient and questions
were answered. Patient expressed their understanding of the biopsy
recommendation. Patient will be scheduled for biopsy at her earliest
convenience by the schedulers. Ordering provider will be notified.
If applicable, a reminder letter will be sent to the patient
regarding the next appointment.

BI-RADS CATEGORY  4: Suspicious.
